# Patient Record
Sex: Female | Born: 1935 | Race: White | Hispanic: No | Marital: Married | Smoking: Never smoker
Health system: Southern US, Community
[De-identification: ages and names within clinical notes are randomized; demographics above are authoritative.]

## PROBLEM LIST (undated history)

## (undated) DIAGNOSIS — F419 Anxiety disorder, unspecified: Secondary | ICD-10-CM

## (undated) DIAGNOSIS — E039 Hypothyroidism, unspecified: Secondary | ICD-10-CM

## (undated) DIAGNOSIS — F32A Depression, unspecified: Secondary | ICD-10-CM

## (undated) DIAGNOSIS — F0390 Unspecified dementia without behavioral disturbance: Secondary | ICD-10-CM

## (undated) DIAGNOSIS — G4700 Insomnia, unspecified: Secondary | ICD-10-CM

## (undated) DIAGNOSIS — I10 Essential (primary) hypertension: Secondary | ICD-10-CM

## (undated) DIAGNOSIS — E079 Disorder of thyroid, unspecified: Secondary | ICD-10-CM

## (undated) DIAGNOSIS — I4891 Unspecified atrial fibrillation: Secondary | ICD-10-CM

## (undated) DIAGNOSIS — I639 Cerebral infarction, unspecified: Secondary | ICD-10-CM

## (undated) DIAGNOSIS — F329 Major depressive disorder, single episode, unspecified: Secondary | ICD-10-CM

## (undated) DIAGNOSIS — H547 Unspecified visual loss: Secondary | ICD-10-CM

## (undated) HISTORY — PX: APPENDECTOMY: SHX54

## (undated) HISTORY — PX: ABDOMINAL HYSTERECTOMY: SHX81

## (undated) HISTORY — PX: EYE SURGERY: SHX253

---

## 1997-03-23 ENCOUNTER — Ambulatory Visit (HOSPITAL_COMMUNITY): Admission: RE | Admit: 1997-03-23 | Discharge: 1997-03-23 | Payer: Self-pay | Admitting: General Surgery

## 1997-08-15 ENCOUNTER — Ambulatory Visit: Admission: RE | Admit: 1997-08-15 | Discharge: 1997-08-15 | Payer: Self-pay | Admitting: Urology

## 1997-08-24 ENCOUNTER — Other Ambulatory Visit: Admission: RE | Admit: 1997-08-24 | Discharge: 1997-08-24 | Payer: Self-pay | Admitting: General Surgery

## 1997-10-15 ENCOUNTER — Observation Stay (HOSPITAL_COMMUNITY): Admission: RE | Admit: 1997-10-15 | Discharge: 1997-10-16 | Payer: Self-pay | Admitting: Urology

## 1998-08-05 ENCOUNTER — Ambulatory Visit (HOSPITAL_COMMUNITY): Admission: RE | Admit: 1998-08-05 | Discharge: 1998-08-05 | Payer: Self-pay | Admitting: Urology

## 1999-03-20 ENCOUNTER — Encounter: Admission: RE | Admit: 1999-03-20 | Discharge: 1999-03-20 | Payer: Self-pay | Admitting: General Surgery

## 1999-03-20 ENCOUNTER — Encounter: Payer: Self-pay | Admitting: General Surgery

## 1999-07-22 ENCOUNTER — Inpatient Hospital Stay (HOSPITAL_COMMUNITY): Admission: EM | Admit: 1999-07-22 | Discharge: 1999-07-30 | Payer: Self-pay | Admitting: Cardiology

## 1999-07-23 ENCOUNTER — Encounter: Payer: Self-pay | Admitting: Cardiology

## 1999-07-25 ENCOUNTER — Encounter: Payer: Self-pay | Admitting: Cardiology

## 1999-07-25 ENCOUNTER — Encounter: Payer: Self-pay | Admitting: *Deleted

## 1999-07-30 ENCOUNTER — Inpatient Hospital Stay (HOSPITAL_COMMUNITY)
Admission: RE | Admit: 1999-07-30 | Discharge: 1999-08-14 | Payer: Self-pay | Admitting: Physical Medicine and Rehabilitation

## 1999-08-18 ENCOUNTER — Encounter
Admission: RE | Admit: 1999-08-18 | Discharge: 1999-10-15 | Payer: Self-pay | Admitting: Physical Medicine and Rehabilitation

## 1999-10-25 ENCOUNTER — Other Ambulatory Visit: Admission: RE | Admit: 1999-10-25 | Discharge: 1999-10-25 | Payer: Self-pay | Admitting: General Surgery

## 1999-10-30 ENCOUNTER — Encounter: Payer: Self-pay | Admitting: General Surgery

## 1999-10-30 ENCOUNTER — Encounter: Admission: RE | Admit: 1999-10-30 | Discharge: 1999-10-30 | Payer: Self-pay | Admitting: General Surgery

## 1999-11-03 ENCOUNTER — Ambulatory Visit (HOSPITAL_BASED_OUTPATIENT_CLINIC_OR_DEPARTMENT_OTHER): Admission: RE | Admit: 1999-11-03 | Discharge: 1999-11-03 | Payer: Self-pay | Admitting: General Surgery

## 2000-06-16 ENCOUNTER — Encounter: Payer: Self-pay | Admitting: General Surgery

## 2000-06-16 ENCOUNTER — Encounter: Admission: RE | Admit: 2000-06-16 | Discharge: 2000-06-16 | Payer: Self-pay | Admitting: General Surgery

## 2000-09-16 ENCOUNTER — Inpatient Hospital Stay (HOSPITAL_COMMUNITY): Admission: RE | Admit: 2000-09-16 | Discharge: 2000-09-20 | Payer: Self-pay | Admitting: Orthopedic Surgery

## 2000-09-16 ENCOUNTER — Encounter: Payer: Self-pay | Admitting: Orthopedic Surgery

## 2000-09-20 ENCOUNTER — Inpatient Hospital Stay (HOSPITAL_COMMUNITY)
Admission: RE | Admit: 2000-09-20 | Discharge: 2000-09-30 | Payer: Self-pay | Admitting: Physical Medicine & Rehabilitation

## 2001-03-09 ENCOUNTER — Encounter: Admission: RE | Admit: 2001-03-09 | Discharge: 2001-05-05 | Payer: Self-pay | Admitting: Orthopedic Surgery

## 2001-06-02 ENCOUNTER — Encounter: Admission: RE | Admit: 2001-06-02 | Discharge: 2001-06-02 | Payer: Self-pay | Admitting: General Surgery

## 2001-06-02 ENCOUNTER — Encounter: Payer: Self-pay | Admitting: General Surgery

## 2002-04-19 ENCOUNTER — Encounter: Admission: RE | Admit: 2002-04-19 | Discharge: 2002-04-19 | Payer: Self-pay | Admitting: Unknown Physician Specialty

## 2002-04-19 ENCOUNTER — Ambulatory Visit (HOSPITAL_BASED_OUTPATIENT_CLINIC_OR_DEPARTMENT_OTHER): Admission: RE | Admit: 2002-04-19 | Discharge: 2002-04-19 | Payer: Self-pay | Admitting: Oral Surgery

## 2002-04-24 ENCOUNTER — Ambulatory Visit (HOSPITAL_BASED_OUTPATIENT_CLINIC_OR_DEPARTMENT_OTHER): Admission: RE | Admit: 2002-04-24 | Discharge: 2002-04-24 | Payer: Self-pay | Admitting: Oral Surgery

## 2002-06-30 ENCOUNTER — Ambulatory Visit: Admission: RE | Admit: 2002-06-30 | Discharge: 2002-09-07 | Payer: Self-pay | Admitting: Radiation Oncology

## 2002-07-05 ENCOUNTER — Encounter: Admission: RE | Admit: 2002-07-05 | Discharge: 2002-07-05 | Payer: Self-pay | Admitting: Dentistry

## 2002-09-08 ENCOUNTER — Ambulatory Visit (HOSPITAL_COMMUNITY): Admission: RE | Admit: 2002-09-08 | Discharge: 2002-09-08 | Payer: Self-pay | Admitting: Radiation Oncology

## 2002-09-10 ENCOUNTER — Encounter: Payer: Self-pay | Admitting: Internal Medicine

## 2002-09-10 ENCOUNTER — Inpatient Hospital Stay (HOSPITAL_COMMUNITY): Admission: EM | Admit: 2002-09-10 | Discharge: 2002-09-18 | Payer: Self-pay | Admitting: Emergency Medicine

## 2002-09-18 ENCOUNTER — Encounter: Payer: Self-pay | Admitting: Orthopedic Surgery

## 2002-10-05 ENCOUNTER — Ambulatory Visit (HOSPITAL_COMMUNITY): Admission: RE | Admit: 2002-10-05 | Discharge: 2002-10-05 | Payer: Self-pay | Admitting: Radiation Oncology

## 2002-12-18 ENCOUNTER — Ambulatory Visit: Admission: RE | Admit: 2002-12-18 | Discharge: 2002-12-18 | Payer: Self-pay | Admitting: Radiation Oncology

## 2002-12-20 ENCOUNTER — Ambulatory Visit: Admission: RE | Admit: 2002-12-20 | Discharge: 2002-12-20 | Payer: Self-pay | Admitting: Radiation Oncology

## 2002-12-23 ENCOUNTER — Emergency Department (HOSPITAL_COMMUNITY): Admission: EM | Admit: 2002-12-23 | Discharge: 2002-12-23 | Payer: Self-pay | Admitting: Emergency Medicine

## 2003-02-15 ENCOUNTER — Ambulatory Visit: Admission: RE | Admit: 2003-02-15 | Discharge: 2003-02-15 | Payer: Self-pay | Admitting: Radiation Oncology

## 2003-03-08 ENCOUNTER — Ambulatory Visit: Admission: RE | Admit: 2003-03-08 | Discharge: 2003-03-08 | Payer: Self-pay | Admitting: Radiation Oncology

## 2003-04-12 ENCOUNTER — Ambulatory Visit: Admission: RE | Admit: 2003-04-12 | Discharge: 2003-04-12 | Payer: Self-pay | Admitting: Radiation Oncology

## 2003-04-20 ENCOUNTER — Ambulatory Visit: Admission: RE | Admit: 2003-04-20 | Discharge: 2003-04-20 | Payer: Self-pay | Admitting: Radiation Oncology

## 2003-06-14 ENCOUNTER — Encounter: Admission: RE | Admit: 2003-06-14 | Discharge: 2003-06-28 | Payer: Self-pay | Admitting: Internal Medicine

## 2003-08-23 ENCOUNTER — Ambulatory Visit: Admission: RE | Admit: 2003-08-23 | Discharge: 2003-08-23 | Payer: Self-pay | Admitting: Radiation Oncology

## 2003-08-30 ENCOUNTER — Ambulatory Visit: Admission: RE | Admit: 2003-08-30 | Discharge: 2003-08-30 | Payer: Self-pay | Admitting: Radiation Oncology

## 2003-08-30 ENCOUNTER — Ambulatory Visit (HOSPITAL_COMMUNITY): Admission: RE | Admit: 2003-08-30 | Discharge: 2003-08-30 | Payer: Self-pay | Admitting: Radiation Oncology

## 2004-01-03 ENCOUNTER — Ambulatory Visit: Admission: RE | Admit: 2004-01-03 | Discharge: 2004-01-03 | Payer: Self-pay | Admitting: Radiation Oncology

## 2004-05-07 ENCOUNTER — Ambulatory Visit: Admission: RE | Admit: 2004-05-07 | Discharge: 2004-05-07 | Payer: Self-pay | Admitting: Radiation Oncology

## 2004-09-03 ENCOUNTER — Ambulatory Visit: Payer: Self-pay | Admitting: Dentistry

## 2004-12-15 ENCOUNTER — Encounter: Admission: RE | Admit: 2004-12-15 | Discharge: 2004-12-15 | Payer: Self-pay | Admitting: Internal Medicine

## 2005-04-18 ENCOUNTER — Inpatient Hospital Stay (HOSPITAL_COMMUNITY): Admission: EM | Admit: 2005-04-18 | Discharge: 2005-04-21 | Payer: Self-pay | Admitting: Emergency Medicine

## 2005-05-13 ENCOUNTER — Ambulatory Visit: Payer: Self-pay | Admitting: Dentistry

## 2005-09-16 ENCOUNTER — Ambulatory Visit: Payer: Self-pay | Admitting: Dentistry

## 2005-10-28 ENCOUNTER — Encounter: Admission: RE | Admit: 2005-10-28 | Discharge: 2005-10-28 | Payer: Self-pay | Admitting: Orthopedic Surgery

## 2006-02-09 ENCOUNTER — Ambulatory Visit: Payer: Self-pay | Admitting: Dentistry

## 2006-04-21 ENCOUNTER — Inpatient Hospital Stay (HOSPITAL_COMMUNITY): Admission: EM | Admit: 2006-04-21 | Discharge: 2006-04-26 | Payer: Self-pay | Admitting: Emergency Medicine

## 2006-04-22 ENCOUNTER — Ambulatory Visit: Payer: Self-pay | Admitting: Physical Medicine & Rehabilitation

## 2006-06-29 ENCOUNTER — Ambulatory Visit: Payer: Self-pay | Admitting: Dentistry

## 2006-11-10 ENCOUNTER — Ambulatory Visit: Payer: Self-pay | Admitting: Dentistry

## 2007-04-20 ENCOUNTER — Ambulatory Visit: Payer: Self-pay | Admitting: Dentistry

## 2007-08-23 ENCOUNTER — Ambulatory Visit: Payer: Self-pay | Admitting: Dentistry

## 2008-04-25 ENCOUNTER — Ambulatory Visit: Payer: Self-pay | Admitting: Dentistry

## 2008-10-04 ENCOUNTER — Ambulatory Visit: Payer: Self-pay | Admitting: Dentistry

## 2008-10-17 ENCOUNTER — Encounter (INDEPENDENT_AMBULATORY_CARE_PROVIDER_SITE_OTHER): Payer: Self-pay | Admitting: *Deleted

## 2009-02-20 ENCOUNTER — Ambulatory Visit: Payer: Self-pay | Admitting: Dentistry

## 2009-02-22 ENCOUNTER — Encounter: Admission: AD | Admit: 2009-02-22 | Discharge: 2009-02-22 | Payer: Self-pay | Admitting: Dentistry

## 2009-08-27 ENCOUNTER — Encounter: Admission: RE | Admit: 2009-08-27 | Discharge: 2009-08-27 | Payer: Self-pay | Admitting: Internal Medicine

## 2010-06-20 NOTE — H&P (Signed)
[name], Diana [name]             ACCOUNT NO.:  [id number]   MEDICAL RECORD NO.:  [id number]          PATIENT TYPE:  EMS   LOCATION:  ED                           FACILITY:  [facility]   PHYSICIAN:  Gwen Pounds, MD       DATE OF BIRTH:  [birth year]   DATE OF ADMISSION:  04/18/2005  DATE OF DISCHARGE:                                HISTORY & PHYSICAL   PRIMARY CARE Kazuo Durnil:  Geoffry Paradise, M.D.   CHIEF COMPLAINT:  More confused than usual.   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  She was seen April 10, 2005 by Wynne Dust, our pharmacist  in the office, and had an INR of 1.5 after a recent nerve block by Dr.  Ethelene Hal, and was on Lovenox at that time as a bridge with her Coumadin.  Yesterday, the patient was not herself and a little more confused, and the  family wondered if she had just taken a little bit of extra medication.  At  about 3 a.m., the patient had a light on in her room, and the patient came  and checked on her.  She was incoherent.  He stayed with her overnight, and  on waking her up this morning, she was still very confused and disoriented.  She also had some slight agitation, and the patient thought that her own  daughter was the patient's mother.  She is currently calm, being admitted  for further evaluation and treatment for this confusion.  Work-up in the  emergency room was otherwise unremarkable.  Her family denies that any other  issues have taken place over the last day or two.   PAST MEDICAL HISTORY:  1.  Anxiety.  2.  Insomnia.  3.  Hyperlipidemia.  4.  Hypothyroidism.  5.  Depression.  6.  Dementia.  7.  Glaucoma with near blindness.  8.  History of CVA.  9.  Atrial fibrillation, on  Coumadin.  10. Squamous cell carcinoma of the mandible required XRT.  11. 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  12. Hypertension.  13. Osteoarthritis.  14. Status post left total knee replacement.  15. Urinary tract infections.  16. Tonsil and adenoidectomy.  17. Bilateral cataract surgery.   ALLERGIES:  1.  SULFA.  2.  CODEINE.  3.  MACRODANTIN.  4.  ADHESIVE TAPE.   MEDICATIONS:  1.  Clorazepate 7.5 daily.  2.  Ambien 5 q.h.s.  3.  Zocor 20 daily.  4.  Levoxyl 125 daily.  5.  Coumadin as directed.  6.  MS Contin 30 b.i.d.  7.  Lexapro 10 daily.  8.  Aricept.  9.  Timolol.  10. Pilocarpine.  11. Lumigan.   SOCIAL HISTORY:  She is a smoker.  She has 1 daughter.  She is married.  She  lives at home with her husband.  FAMILY HISTORY:  Mother died in her 32s of kidney issues.  Father died at  the age of 31 of pneumonia.   REVIEW OF SYSTEMS:  The patient with dry mouth, not eating or drinking very  well.  Please see history of present illness for further details, but she  denies any current chest pain, but did have shortness of breath on arrival.   PHYSICAL EXAMINATION:  VITAL SIGNS:  Temperature 96.3, heart rate 76,  respiratory rate 18, blood pressure 192/98, down to 151/74.  GENERAL:  She is alert and oriented to person, [facility], a day.  is not oriented to year, month, and does not have any insight as to why she  is here.  HEENT:  Status post jaw surgery and changes compatible.  Oropharynx is dry.  PULMONARY:  Clear to auscultation bilaterally.  CARDIAC:  Regular at this time.  ABDOMEN:  Soft.  EXTREMITIES:  No clubbing, no cyanosis, no edema.   ANCILLARY DATA:  Urinalysis shows nitrites, many bacteria, but 0-3 white  cells per high power field.  Urine drug screen was positive for  benzodiazepines and opiates.  PTT was 46, PT INR of 2.4.  Ethyl alcohol less  than 5.  Sodium 136, potassium 4.2, chloride 98, bicarbonate 30, BUN 16,  creatinine  0.7, glucose 124.  LFT's are within normal limits.  White count  is 5.4.  Hemoglobin 13.2, platelet count 272, 81% segs.  EKG revealed sinus  bradycardia, left axis deviation.  Nothing acute.  Cranial CT revealed no  intracranial abnormalities.  Chest x-ray is hyperinflated.  No acute  cardiopulmonary disease.   ASSESSMENT:  This is an elderly female with mild dementia, being admitted  with increasing confusion and a questionable urinary tract infection.  Nothing else obvious going on.  I suppose she could have had a stroke or  that she could be overly medicated at this current time.   PLAN:  1.  Admit.  2.  IV fluids to be started.  3.  Treat for potential urinary tract infection with Cipro.  Will check      blood and urine cultures.  4.  Will try to keep INR between 2 and 3.0.  5.  Home medications.  6.  Eye drops per home.  7.  If IV fluids and antibiotics are not helpful, consider MRI in the      morning.  8.  No meningeal signs to suggest that she needs an LP.  Will follow her      blood pressure; it is a little bit elevated at this time, and will      regulate her medications carefully.      Gwen Pounds, MD  Electronically Signed     JMR/MEDQ  D:  04/18/2005  T:  04/20/2005  Job:  [id number]

## 2010-06-20 NOTE — Discharge Summary (Signed)
[facility]  Patient:    Diana [name], Diana [name] Visit Number: [id number] MRN: [id number]          Service Type: [facility] Location: [id number] Attending Physician:  Faith Rogue T Dictated by:   Mcarthur Rossetti. Angiulli, P.A. Adm. Date:  09/20/2000 Disc. Date: 09/30/00   CC:         Nadara Mustard, M.D.  Richard A. Jacky Kindle, M.D.   Discharge Summary  DISCHARGE DIAGNOSES: 1. Left total knee arthroplasty September 16, 2000. 2. Postoperative anemia. 3. History of cerebrovascular accident June 2001, with chronic Coumadin. 4. Hypothyroidism. 5. Hypertension. 6. E coli urinary tract infection.  HISTORY OF PRESENT ILLNESS:  A 77 year old white female with history of cerebrovascular accident in June of 2001, of which she did receive inpatient rehab services for, maintained on chronic Coumadin, was admitted September 16, 2000, with end-stage degenerative joint disease of the left knee and no relief with conservative care.  She underwent a left total knee arthroplasty September 16, 2000, per Nadara Mustard, M.D., weightbearing as tolerated.  Her Coumadin was resumed.  Her hospital course was uneventful. There was no chest pain or shortness of breath. She was minimal assistance for ambulation.  Active range of motion to the left knee was at 85 degrees. Latest INR was 1.3, hemoglobin 9.4.  Chemistries unremarkable.  She was admitted for a comprehensive rehab program.  PAST MEDICAL HISTORY:  Hypothyroidism and hypertension.  PAST SURGICAL HISTORY:  Appendectomy, thyroidectomy, foot surgery and cataract surgery.  ALLERGIES:  CODEINE, ASPIRIN, MORPHINE, SULFA AND MACRO.  PRIMARY M.D.:  Richard A. Jacky Kindle, M.D.  MEDICATIONS PRIOR TO ADMISSION: 1. Levoxyl 125 mcg daily. 2. Coumadin 4 mg daily. 3. Zestril 10 mg daily. 4. Tranxene 7.5 mg as needed 5. Eyedrops.  SOCIAL HISTORY:  Occasional alcohol, no tobacco.  She lives with her husband in [place], [place].  She was independent prior to admission.  She did not use any assistive device.  She lives in a one level home with no steps to entry.  Husband to assist on discharge.  Daughter in the area also.  HOSPITAL COURSE:  The patient did well while on rehabilitation services with therapies initiated on a b.i.d. basis.  The following issues were followed during the patients rehab course.  Pertaining to Ms. Harrisons left total knee arthroplasty, remained stable. Surgical site healing nicely.  No signs of infection.  She was weightbearing as tolerated with a walker.  Her range of motion continued to improve and she would follow up with Dr. Lajoyce Corners.  Pain medications were adjusted due to some mild mental status changes.  Her Tranxene, which had been as needed prior to hospital admission had been scheduled twice daily during hospital course.  Once these necessary adjustments were made, the patient returned to her baseline. All issues had also been discussed with family.  She continued on chronic Coumadin for history of cerebrovascular accident.  Latest INR was 3.1.  She would resume follow-up with her primary M.D., Dr. Jacky Kindle.  Her blood pressures remained controlled.  She was on her hormone supplement for hypothyroidism.  During her hospital course she was treated for an E coli urinary tract infection with Levaquin.  She had no dysuria or hematuria.  Due to a history of cerebrovascular accident and latest knee surgery, patients mood and spirits were somewhat depleted.  This was discussed as per family. She was placed on trial doses of Paxil on September 25, 2000, and monitored.  Family would provide necessary emotional support on discharge.  Overall for her functional mobility, she continued to progress. She was ambulating with a walker household distances, needing some assistance for lower body dressing.  Plan was for home health physical and occupational therapy as well as a nurse  with anticipated discharge for September 30, 2000, to home with family.  LATEST LABS:  INR of 3.1.  Hemoglobin on September 29, 2000, or 9.3; this remained stable, varying from 8.9 to 9.3.  Chemistries with sodium 133, potassium 3.9, BUN 15, creatinine 0.9.  DISCHARGE MEDICATIONS:  1. Coumadin daily.  The patient had been receiving 5 mg during her latest     adjustments per pharmacy and this will continue to be followed per Dr.     Jacky Kindle.  2. Zestril 10 mg daily.  3. Timoptic 0.5% one drop both eyes twice daily.  4. Synthroid 125 mcg daily.  5. Trinsicon twice daily.  6. Xanax 0.25 mg as needed.  7. Flomax 0.4 mg at bedtime.  8. Paxil 20 mg daily.  9. Tranxene continue as directed as prior to hospital admission. 10. Ultram 50 mg every six hours as needed.  ACTIVITY:  Weightbearing as tolerated with walker.  DIET:  Regular.  WOUND CARE:  Cleanse incision daily with warm soap and water.  SPECIAL INSTRUCTIONS:  Home health nurse for Coumadin therapy.  [facility] to provide nurse to be followed prothrombin times called to Dr. Jacky Kindle. Home health physical and occupational therapy also had been arranged.  FOLLOW-UP:  She should follow up with Dr. Lajoyce Corners in two weeks. Dictated by:   Mcarthur Rossetti. Angiulli, P.A. Attending Physician:  Faith Rogue T DD:  09/29/00 TD:  09/29/00 Job: [id number]

## 2010-06-20 NOTE — H&P (Signed)
[facility]  Patient:    Diana [name], Diana [name]                    MRN: [id number] Adm. Date:  [id number] Attending:  Nadara Mustard                         History and Physical  HISTORY OF PRESENT ILLNESS:  The patient is a 77 year old woman who has chronic osteoarthritis of her left knee. The patient has failed conservative care and has been cleared by Richard A. Jacky Kindle, M.D. and presents at this time for a left total knee arthroplasty.  ALLERGIES:  CODEINE causes nausea and vomiting. ASPIRIN, MORPHINE, SULFA, ADHESIVE TAPE, and MACRODANTIN.  CURRENT MEDICATIONS: 1. Levoxyl 125 mcg q.d. 2. Coumadin 4 mg per day. 3. ______  25 mg p.r.n. 4. Zestril 10 mg q.d. 5. Clorazepate 7.5 mg p.r.n. 6. Ambien 5 mg q.p.m. 7. Timolol eyedrops 0.5% b.i.d. for glaucoma.  PAST MEDICAL HISTORY:  Significant for thyroidectomy, appendectomy, foot surgery x 2, benign breast lump excision, bladder surgery x 2, arthritis.  REVIEW OF SYSTEMS:  Positive for hypertension, stroke, glaucoma, and hypothyroidism.  FAMILY HISTORY:  Positive for arthritis.  PHYSICAL EXAMINATION:  VITAL SIGNS:  Temperature 95.7, pulse 72, respiratory rate 12, blood pressure 118/74.  GENERAL:  She is in no acute distress.  NECK:  Supple. No bruits.  LUNGS:  Clear to auscultation.  CARDIOVASCULAR:  Regular rate and rhythm.  EXTREMITIES:  On examination of the left lower extremity, she has 10 degree flexion contracture, flexion to 90 degrees. No significant varus valgus ______ ambulation. Does have good palpable pulses.  LABORATORY DATA:  Radiographs show a tricompartmental osteoarthritis of the left knee.  ASSESSMENT:  Osteoarthritis of the left knee.  PLAN:  She is scheduled for a left total knee arthroplasty at this time. The risks and benefits were discussed including infection, neurovascular injury, persistent pain, failure of implant, need for additional surgery. The  patient states she understands and wishes to proceed at this time.DD:  09/16/00 TD:  09/16/00 Job: [id number]

## 2010-06-20 NOTE — H&P (Signed)
[facility]  Patient:    Diana [name], Diana [name]               MRN: [id number] Adm. Date:  [id number] Attending:  De Nurse                         History and Physical  CHIEF COMPLAINT:  Dizzy, vomiting.  PRESENT ILLNESS:  This 77 year old white female, healthy, working daily, had seemed to be in her usual state of health.  She was preparing to go to work when she rather abruptly developed some dizziness, sense of imbalance, which quickly progressed over 30 to 60 minutes to major imbalance, nausea, then vomiting on several occasions.  She was brought to the office at around noon with this symptomatology, obvious gross balance disturbance.  Vital signs were normal.  Electrocardiogram showed occasional VPC but otherwise normal.  She also complained of some numbness in the right fingers.  There were no lateralizing neurologic findings.  With the sense that this was probably an acute benign vertigo, she was kept in the office, given Phenergan IM.  Over several hours, she continued with her symptoms to include several rounds of vomiting.  It was clear that she could not be cared for at home, and so she was brought to the hospital for observation admission, and was transport-required.  There was scant improvement overnight in the hospital to include total inability to either sit or stand without major assistance, hence, observation status is changed to full admission status.  PAST MEDICAL HISTORY:  There is no semblance of any previous episode to the present illness.  PAST SURGICAL HISTORY:  Prior operations include tonsillectomy, appendectomy, benign left breast cyst aspiration, thyroidectomy for goiter, several different foot surgeries, urethral sling surgery in September of 1999.  OBSTETRICAL HISTORY:  One pregnancy and delivery.  OTHER HOSPITALIZATIONS:  In years prior to my first contact in 1996, she had hospitalization for anxiety along  with tests for hypoglycemia.  ALLERGIES:  Allergies are reported to SMOKE, SULFA, CODEINE, MACRODANTIN, ADHESIVE, ANTIDEPRESSANT MEDICATIONS.  MEDICATIONS:  Prempro 2.5 mg, Synthroid 0.125 mg, Prinivil 20 mg, all daily; also, clorazepate 7.5 mg most evenings as relaxant and sleep aid.  FAMILY HISTORY:  Father died at age 4 of pneumonia.  Mother died at age 56 of "renal poisoning."  There are three living siblings -- several with some chronic illnesses and one deceased sibling, dying in infancy.  No family history of malignancy, diabetes, major vascular disease.  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  She does not smoke.  She generally is high-strung, has ongoing strains in her marriage of 43 years.  Her single daughter has been an ongoing source of concern to include need for some financial support, although situation has been ameliorated with the recent successful marriage of that daughter.  REVIEW OF SYSTEMS:  Prior to present illness, any symptoms related by patient to stress.  She has not had chronic headache, seizure or syncope.  There has not been chest pain, breathing disturbance, sense of palpitation.  Bowel function satisfactory.  There has been a major ongoing problem with urinary tract to include incontinence.  It should have been mentioned in past history above that she had urethral sling surgery in September of 1999 -- unsuccessful, with ongoing urinary incontinence.  No significant arthritis.  PHYSICAL EXAMINATION:  GENERAL:  Patient is well-nourished, well-developed, actually appears younger than her  stated age.  She seems acutely ill and uncomfortable.  She has been observed to have retching and vomiting.  Any attempts to sit up or move head aggravate major dizziness, vertigo.  VITAL SIGNS:  Blood pressure 148/80, temperature 97, pulse 80, respirations 14.  HEENT:  Ears normal.  Pupils, conjunctivae  and sclerae are normal.  The patient seems to be mildly proptotic.  Extraocular movements are full, possibly some terminal nystagmus to left.  Mucous membranes remain moist with pharynx negative.  NECK:  Supple.  Thyroid not enlarged -- thyroidectomy scar, with no palpable thyroid tissue.  No carotid bruits.  CHEST:  Clear.  BREASTS:  No masses.  HEART:  Sinus rhythm.  Occasional PVC noted on office EKG.  ABDOMEN:  Soft, nontender.  Liver, spleen or abdominal masses not palpable.  PELVIC:  Not pertinent to present illness and not performed.  RECTAL:  Not pertinent to present illness and not performed.  EXTREMITIES:  Pedal pulse is present.  No edema.  NEUROLOGIC:  No lateralizing motor, sensory or reflex changes.  IMPRESSION: 1. Acute labyrinthitis -- vertigo with vomiting -- possibly on a circulatory    basis. 2. History of hypertension, hormone replacement, thyroidectomy, on    replacement.  PLAN:  The patient will be continued intravenous fluids, adding a diet as necessary.  CT scan will be done today to rule out any intracranial process. She is on meclizine. DD:  07/24/99 TD:  07/24/99 Job: [id number]

## 2010-06-20 NOTE — Consult Note (Signed)
Diana [name], Diana [name]             ACCOUNT NO.:  [id number]   MEDICAL RECORD NO.:  [id number]          PATIENT TYPE:  INP   LOCATION:  6709                         FACILITY:  [facility]   PHYSICIAN:  Erasmo Leventhal, M.D.DATE OF BIRTH:  04/11/1925   DATE OF CONSULTATION:  04/21/2006  DATE OF DISCHARGE:                                 CONSULTATION   ORTHOPEDIC CONSULTATION   HISTORY OF PRESENT ILLNESS:  Diana [name] is a 77 year old Caucasian  female currently admitted by Dr. Buren Kos for inability to ambulate  and left groin hip and buttock pain.  The patient and her daughter  report that she has fallen a couple of times this week.  She fell last  night without syncope and landed on her left side.  She had difficulty  ambulating and presented to [facility].  There she  was seen by Dr. Clelia Croft where she was unable to ambulate and take care of  herself.  Plain x-rays initially were unremarkable.  However, CT scan  has revealed left hemipelvis fractures, and an orthopedic consultation  was obtained.   The patient denies any syncopal episodes, or chest pain, or shortness of  breath with her falls.  She only complains of left lateral thigh buttock  pain and left groin pain.   ALLERGIES:  1. CODEINE.  2. PERCOCET.  3. SULFA.   CURRENT MEDICATIONS:  Levoxyl, Coumadin, Lexapro, Zocor, Ambien,  morphine, clorazepate, pilocarpine, and Lumigan.   PAST MEDICAL HISTORY:  1. Status post history of CVA.  2. Atrial fibrillation on anticoagulation Coumadin.  3. Hypertension.  4. Mild dementia.  5. Right mandible squamous cell carcinoma status post resection,      radiation.  6. Increased lipid.  7. Hypothyroid.  8. Anxiety and depression.  9. Glaucoma.  10.Osteoarthritis.  11.Recurrent UTIs.  12.Status post left total knee replacement documented in the past with      continued chronic pain.  She has had several evaluations without      cause for such.  Dr.  __________ evaluated for a painful left total      knee last fall, but workup was negative.   SOCIAL HISTORY:  Is married.  Lives with her husband, who is 21 years of  age.  No tobacco or alcohol.   FAMILY HISTORY:  Noncontributory.   PHYSICAL EXAMINATION:  VITAL SIGNS:  Blood pressure is 147/79, pulse 70  and irregular, temperature 97.9, room-air oxygenation 99%.  GENERAL:  Awake, alert, oriented to person, place, time, and  circumstances.  Accompanied by her daughter.  Thin, cachectic elderly  appearing female.  BILATERAL EXTREMITIES:  Upper extremities are unremarkable except for a  small abrasion left forearm.  Right lower extremity unremarkable.  Left  lower extremity, leg lengths are equal.  There is a knee brace on the  left side which she wears on a chronic basis.  Tibia, knee, fibula  unremarkable.  General internal and external rotation of the hip is  nonpainful.  She dose have a tender lateral hip and buttock region.  She  cannot actively flex her left hip or lift  the left leg on her own.  Distal neurovascular examination is grossly intact.   Plain x-rays and CT scan were reviewed, and these revealed the following  findings:  She has a left nondisplaced sacral fracture, a left  nondisplaced inferior pubic ramus fracture, a small nondisplaced left  anterior acetabular fracture.   IMPRESSION:  Left hemipelvis fractures as described above, relatively  stable.   RECOMMENDATIONS:  At this point and time, they just need analgesics,  muscle relaxants of choice, PT/OT consultation.  I would recommend 50%  partial weightbearing for comfort for the first four weeks and then  weightbearing as tolerated with new x-rays at that time.  The fracture  should heal in approximately eight weeks, and at this point and time she  is anticoagulated on Coumadin for DVT prophylaxis.  She should be  mobilized as soon as possible.  I would recommend calcium and vitamin D  500 mg 1 three times a  day after meals.  Would add Robaxin for muscle  spasm, and she already has morphine ordered for pain.   Analgesics of choice per Dr. Clelia Croft, Dr. Jacky Kindle.  All questions  encouraged and answered by the patient and her daughter in detail.  I  also called and discussed this with Dr. Clelia Croft.           ______________________________  Erasmo Leventhal, M.D.     RAC/MEDQ  D:  04/21/2006  T:  04/22/2006  Job:  [id number]   cc:   Kari Baars, M.D.  Geoffry Paradise, M.D.

## 2010-06-20 NOTE — Discharge Summary (Signed)
[place]  Patient:    Diana [name], Diana [name] Visit Number: [id number] MRN: [id number]          Service Type: [facility] Location: [id number] Attending Physician:  Faith Rogue T Dictated by:   Nadara Mustard, M.D. Admit Date:  09/20/2000 Discharge Date: 09/30/2000                             Discharge Summary  DIAGNOSIS:  Osteoarthritis, left knee.  PROCEDURE:  Left total knee arthroplasty.  DISPOSITION:  Discharged to rehab in stable condition.  Followup in the office in two weeks.  HISTORY OF PRESENT ILLNESS:  The patient is a 77 year old woman with chronic osteoarthritis of the left knee.  She has failed conservative care, was cleared medically, and presents at this time for total knee replacement.  HOSPITAL COURSE:  Brief operative note of September 16, 2000, diagnosis of osteoarthritis of left knee.  Procedure was left total knee arthroplasty with [employer] components, #7 patella, femur, and tibial component, 10 mm poly tray, posterior stabilized.  Surgeon was Dr. Lajoyce Corners.  Anesthesia was general endotracheal.  Estimated blood loss was minimal.  Antibiotics 1 g of Kefzol.  Tourniquet time 84 minutes at 300 mmHg.  Disposition to PACU in stable condition.  The patients postoperative course was essentially unremarkable.  She was treated with Coumadin for DVT prophylaxis and Kefzol for infection prophylaxis.  She progressed well with therapy and was transferred to rehab in stable condition on September 20, 2000.  Followup in the office two weeks after discharge. Dictated by:   Nadara Mustard, M.D. Attending Physician:  Faith Rogue T DD:  10/13/00 TD:  10/13/00 Job: [id number]

## 2010-06-20 NOTE — H&P (Signed)
[name], [name]             ACCOUNT NO.:  [id number]   MEDICAL RECORD NO.:  [id number]          PATIENT TYPE:  EMS   LOCATION:  MAJO                         FACILITY:  [facility]   PHYSICIAN:  Kari Baars, M.D.  DATE OF BIRTH:  1926-01-03   DATE OF ADMISSION:  04/21/2006  DATE OF DISCHARGE:                              HISTORY & PHYSICAL   CHIEF COMPLAINT:  Left thigh pain following a fall.   HISTORY OF PRESENT ILLNESS:  Diana [name] is a 77 year old white female  patient of Dr. 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  She states that  she has had about a three-day history of generalize malaise with no  focal symptoms.  Last evening after getting out of bed and walking, she  fell and landed on her left side.  She states that she slipped after  opening the door.  She did not have any loss of consciousness and did  not hit her head.  She was helped back into bed with her husband, but  had extreme difficulty bearing weight.  This morning due to difficulty  getting out of bed, she called EMS because she was unable to walk.  She  was evaluated in the emergency department and had negative plain x-rays  of her pelvis, left femur, and hip.  Her family at this point does not  feel that she can return home because she is unable to bear weight.   REVIEW OF SYSTEMS:  All systems were reviewed with the patient and are  negative except as in the HPI with the following exceptions:  She has  had chronic left knee pain and has had multiple orthopedic evaluations  by several different orthopedists.  This is the knee that she had a knee  replacement.  She has been seeing a chiropractor recently with some  improvement with immobilizer therapy.  She takes chronic morphine for  this  pain.  She also reports insomnia and takes Ambien.  She took Ambien  and morphine before her fall last night.  She had also had recent  diarrhea.  Multiple recurrent urinary tract infections including recent  antibiotic therapy, resolved.  All other systems negative.   PAST MEDICAL HISTORY:  1. Atrial fibrillation on anticoagulation.  2. History of stroke.  3. Hypertension.  4. Mild dementia.  5. Right mandible squamous cell cancer status post resection with      tracheostomy now reversed and radiation therapy (T4N0).  6. Hyperlipidemia.  7. Hyperthyroidism.  8. Anxiety/depression.  9. Glaucoma.  10.Osteoarthritis.  11.Recurrent urinary tract infection.  12.Status post left total knee replacement.   CURRENT MEDICATIONS:  1. Levoxyl 125 mcg daily.  2. Coumadin 4 mg daily.  3. Lexapro 20 mg daily.  4. Zocor 20 mg daily.  5. Ambien 5 mg daily.  6. Morphine 30 mg b.i.d.  7. Clorazepate 7.5 mg daily.  8. Pilocarpine eye drops q.i.d. to the left  eye.  9. Lumigan eye drops bilaterally daily.   ALLERGIES:  CODEINE, PERCOCET AND SULFA.   SOCIAL HISTORY:  She is married and lives with her husband.  Smoking  history.   FAMILY HISTORY:  Mother died from kidney disease.  Father had pneumonia.   PHYSICAL EXAMINATION:  VITALS:  Temperature 97.9, blood pressure 147/79,  pulse 69, respirations 18, oxygen saturation 99% on room air.  GENERAL:  She is pleasant, alert and oriented in no acute distress.  HEENT:  Shows pupils equal, round and reactive to light.  Extraocular  movements intact.  Oropharynx is moist.  Chronic changes from left neck  surgery.  HEART:  Regular rate and rhythm without murmurs, rubs of gallops.  LUNGS:  Clear to auscultation bilaterally.  ABDOMEN:  Soft, nondistended, nontender with normoactive bowel sounds.  EXTREMITIES:  Are symmetric with no rotation.  She has minimal  tenderness over the left medial thigh.  Has on the left knee  immobilizer.  There is no  effusion.  No masses appreciated.   LABS:  CBC shows a white count of 7.2, hemoglobin 13.1, platelets 186.  B-Met significant for sodium 135, potassium 4.4, chloride 104, bicarb  29, BUN 16, creatinine 0.7, glucose 105.  INR is 2.0.   STUDIES:  Hip, pelvis, and left femur x-rays show no fracture.   ASSESSMENT/PLAN:  1. Left thigh pain following a fall - there was no evidence of      fracture on the x-rays.  I will obtain a CT of the left femur to      rule out occult fracture or a hematoma in the setting of her      anticoagulation therapy.  Will ask orthopedics to see the patient      pending these results.  Will continue her morphine as needed.      PT/OT evaluation.  2. Failure to thrive - her fall may have been precipitated by      polypharmacy and sedating medications.  Will evaluate for need for      acute rehab versus possibility for return home with physical      therapy evaluation.  Obtain urinalysis to rule out UTI.  Obtain CK      to rule out rhabdomyolysis, low likelihood.  3. Polypharmacy - defer medication changes to her primary care      physician.  May want to consider decreasing sedating medications.  4. Atrial fibrillation - continue Coumadin per pharmacy protocol.  She      is currently in sinus rhythm.  5. Disposition - anticipate discharge to home with home health      physical therapy or rehab placement depending on how she does with      her pain.      Kari Baars, M.D.  Electronically Signed     WS/MEDQ  D:  04/21/2006  T:  04/21/2006  Job:  [id number]   cc:   Geoffry Paradise, M.D.  Ronald A. Darrelyn Hillock, M.D.

## 2010-06-20 NOTE — Op Note (Signed)
NAME:  Diana [name], Diana [name]                       ACCOUNT NO.:  [id number]   MEDICAL RECORD NO.:  [id number]                   PATIENT TYPE:  AMB   LOCATION:  DSC                                  FACILITY:  [facility]   PHYSICIAN:  Hinton Dyer, D.D.S.            DATE OF BIRTH:  1925-06-07   DATE OF PROCEDURE:  04/24/2002  DATE OF DISCHARGE:                                 OPERATIVE REPORT   PREOPERATIVE DIAGNOSIS:  Osteomyelitis of the right mandible.   POSTOPERATIVE DIAGNOSIS:  Osteomyelitis of the right mandible.   PROCEDURES:  1. Surgical debridement of osteomyelitis.  2. Extraction of teeth #25, 26, and 27.   ANESTHESIA:  General.   SURGEON:  Hinton Dyer, D.D.S.   ESTIMATED BLOOD LOSS:  Approximately 100 mL.   CONDITION AFTER SURGERY:  Good.   DESCRIPTION OF PROCEDURE:  Following preoperative medication, the patient  was brought and placed in the supine position, in which she remained  throughout the whole procedure.  She was intubated by an oral endotracheal  tube and the tube was placed off to the left side.  The face was prepped and  draped in the usual fashion for an intraoral procedure.  The throat pack was  placed around the endotracheal tube using a moist open 4 x 4 gauze and 2%  Xylocaine with 1:100,000 epinephrine was infiltrated on the medial aspect of  the right mandible.  The buccal aspect of the mandible was also infiltrated.  A total of 3 mL was infiltrated.  A 15 blade was used to make an incision  over the crest of the ridge and around the defect of the granulomatous  tissue that was extended on the crest of the ridge, extending from tooth #28  to the tooth #30, 31 area.  This tissue was then excised using the 15 blade,  curetted off the bone, and submitted in its entirety to pathology.  Immediately the mandible underneath was exposed and the area was curetted,  removing any loose or abnormal-looking bone.  Once I was down to hard,  normal-looking  bone, the area was irrigated out.  Tooth #27 was elevated  with a periosteal elevator and removed from the socket with the same  periosteal elevator.  Tooth #26 was also grossly mobile and removed with the  same periosteal elevator, and tooth #25 was also very mobile and removed  from the socket.  All three sockets were curetted aggressively, removing  granulation tissue and abnormal bone around the apices of all three teeth.  There was no abnormality anterior to tooth #25, and so no further teeth were  removed.  The area was again aggressively curetted, removing any remnants of  soft tissue and loose bone until good, hard bone was encountered.  The whole  defect was then examined and then a round bur and copious irrigation were  used to do a peripheral ostectomy, smoothing off any sharp areas  and  removing a layer of bone around the whole defect.  Bleeding sites were noted  in the bone from all areas, and the area was copiously irrigated out.  There  was enough flap to close the defect primarily and after copious irrigation,  multiple 4-0 Vicryl sutures were used to close the defect.  The throat pack  was then removed.  She was extubated on the table and returned to the recovery room in good  condition.  She is on Augmentin and is being sent home with a prescription  for Vicodin x 20, one or two q.4h. p.r.n. pain.  She will be followed by me  closely in my private office.                                                Hinton Dyer, D.D.S.    Lynden Ang  D:  04/24/2002  T:  04/24/2002  Job:  [id number]

## 2010-06-20 NOTE — Discharge Summary (Signed)
NAME:  Diana [name], Diana [name]                       ACCOUNT NO.:  [id number]   MEDICAL RECORD NO.:  [id number]                   PATIENT TYPE:  INP   LOCATION:  0462                                 FACILITY:  [facility]   PHYSICIAN:  Geoffry Paradise, M.D.               DATE OF BIRTH:  [birth year]   DATE OF ADMISSION:  09/10/2002  DATE OF DISCHARGE:  09/18/2002                                 DISCHARGE SUMMARY   DIAGNOSES AT THE TIME OF DISCHARGE:  1. Hypotension secondary to volume depletion.  2. Radiation mucositis, esophagitis, and enteritis.  3. Squamous cell carcinoma of the right mandible, status post surgery and     radiation therapy.  4. Essential hypertension.  5. Primary hypothyroidism.  6. Remote cerebrovascular accident, on chronic Coumadin.  7. Chronic anxiety and depression.  8. Osteoarthritis with remote left total knee replacement.   HISTORY OF PRESENT ILLNESS:  Diana [name] is a 77 year old white female  with squamous cell carcinoma of the right mandible, T4, N0, status post  trach, right selective node dissection, right mandibulectomy, iliac crest  flap, April 2004, now status post seven weeks of external beam radiation  therapy, having finished last week.  She was hypotensive and saw the  radiation therapist last week, and she was placed, for the day, in the  hospital and given IV fluids for an hour and a half.   She represents on the day of admission with severe fatigue, lethargy,  failure to thrive, and blood pressures with systolic blood pressures down to  70, and, upon evaluation in the emergency room, where she was noted to be  markedly volume depleted and admitted for further management.  For details,  see the dictated summary on the chart.   LABORATORY DATA:  Chest x-ray:  No acute cardiopulmonary disease.  EKG:  Normal sinus rhythm.  Normal EKG.  C. Difficile was negative.  Occult blood  was negative.  CBC:  Hemoglobin was 10.8, hematocrit 31.5, white blood  cell  count 6.1; platelet count was 281,000.  PT/INR upon presentation was 5.4.  Chemistries:  Sodium 131, potassium 4.9, chloride 104, CO2 25, BUN 32,  creatinine 1.1, calcium 8.3, protein 6.6, albumin 3.2, SGOT 25, SGPT 36, alk  phos 127, bilirubin 0.5, magnesium 2.2, phosphorus 3.5; TSH 2.16; cortisol  was normal at 6.9.  Urinalysis was negative.  Blood cultures were negative.  Stool cultures were negative.   HOSPITAL COURSE:  Patient was admitted.  No sign of sepsis.  All findings  compatible with volume depletion.  Despite some minimal findings in urine,  there were no urinary symptoms, and this was felt mainly a volume-related  issue.  This, in conjunction, with the oropharyngeal symptoms and low-grade  diarrhea.  Patient was aggressively hydrated with stabilization of blood  pressure.  Potassium was aggressively replenished.  Coumadin held, and PEG  placement, per GI consultation, given the difficulty patient was having with  adequate oral intake.  PEG was placed without difficulty, and PEG care and  feedings were initiated and instructed to family, to include both daughter  and husband.  She tolerated this quite well and continued to take in some  mild-to-modest amounts of p.o. intake, which fell below her caloric needs as  well.   Prior to discharge, she was ambulating in the hallway, and family competent  with PEG care, according to patient and staff.  She did have some low-grade  fevers, but chest x-ray was clear.  Abdominal exam was benign and pending at  the time of this dictation was a repeat chest x-ray and urine, to be done  prior to discharge.  This will be followed up as an outpatient with  interventions as needed.   The diarrhea was felt secondary to radiation, as all workup to include C.  difficile, C&S, and occult blood were negative.  This did respond to  Imodium.   Coumadin was re-initiated, and this will be continued as an outpatient.   Patient is discharged  in improved condition.  Family and she are competent  with PEG placement.  She is to take in three cans of Osmolite daily as well  as supplemental water to the tune of 100 cc q.i.d.  This is in addition to  her normal meals, which should meet her needs adequately.  She is taking  Imodium for her diarrhea.  Pending is a chest x-ray and urine to be done the  morning of this discharge prior to discharge.   DISCHARGE MEDICATIONS:  1. She is to resume home medications, to include her clorazepate 7.5 q.h.s.  2. Ambien p.r.n.  3. Synthroid 0.125 q.d.  4. Lisinopril 10 mg daily.  5. Coumadin 4 mg daily.  6. Eye drops as prior instructed and __________ taken at home.   FOLLOW UP:  She is to follow up with Radiation Oncology and Oncology as  instructed as well.                                               Geoffry Paradise, M.D.    RA/MEDQ  D:  09/18/2002  T:  09/18/2002  Job:  [id number]

## 2010-06-20 NOTE — Discharge Summary (Signed)
[place]  Patient:    Diana [name], Diana [name]               MRN: [id number] Adm. Date:  [id number] Disc. Date: 08/14/99 Attending:  Evern Core Dictator:   Bynum Bellows. Idacavage, P.A.C. CC:         Edwin L. Judie Grieve, M.D.             Rodrigo Ran, M.D.             Laurier Nancy, M.D.             Candy Sledge, M.D.                           Discharge Summary  DIAGNOSES: 1. Cerebrovascular accident. 2. Hypertension. 3. Hypothyroidism. 4. Anxiety. 5. Enterococcus urinary tract infection. 6. Dizziness.  HOSPITAL COURSE:  The patient is a 77 year old female originally admitted to [facility] with dizziness and nausea and vomiting by Dr. Judie Grieve.  She was seen by Dr. Noreene Filbert.  Carotid duplex was negative.  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  The patient had a history of posterior circulation TIAs.  Right vertebral narrowing was noted per MRA.  The patient was placed on heparin and converted to Coumadin.  She was admitted to [facility] inpatient rehab July 30, 1999, where she has participated in physical and occupational therapies.  On admission urine culture, she was noted to have enterococcus UTI and was treated with amoxicillin for this.  She did continue to have dizziness which limited her participation in therapies at that time and was placed on Antivert.  This seemed to alleviate some of her symptoms.  She has participated well in therapies.  At the time of discharge, she is noted to be supervision with her bed mobilities, modified independent with her transfers, able to ambulate 150 feet with a rolling walker at the supervision level.  DISCHARGE MEDICATIONS: 1. Antivert 25 mg twice a day. 2. Aspirin 81 mg daily. 3. Coumadin dose to be decided at discharge. 4. Prinivil 10 mg daily. 5. Levothroid 125 mcg daily.  She does require intermittent supervision.  She is asked  to follow a balanced diet.  She will have outpatient physical and occupational therapies. She will go to Dr. Algie Coffer office Monday for PT-INR and follow up with Dr. Waynard Edwards who will be taking over Dr. Algie Coffer patients.  CONDITION ON DISCHARGE:  Stable. DD:  08/13/99 TD:  08/13/99 Job: [id number]

## 2010-06-20 NOTE — Op Note (Signed)
[place]  Patient:    Diana [name], Diana [name]                    MRN: [id number] Proc. Date: 11/03/99 Adm. Date:  [id number] Attending:  Janalyn Rouse CC:         Richard A. Jacky Kindle, M.D.   Operative Report  PREOPERATIVE DIAGNOSIS:  Right breast mass, probable fibrocystic disease.  POSTOPERATIVE DIAGNOSIS:  Right breast mass, probable fibrocystic disease.  OPERATION:  Excision of right breast mass.  SURGEON:  Rose Phi. Maple Hudson, M.D.  ANESTHESIA:  MAC.  DESCRIPTION OF PROCEDURE:  The patient was placed on the operating table and the right breast prepped and draped in the usual fashion.  A curvilinear incision was outlined over the palpable mass at the 9 oclock position of the right breast.  The area was then infiltrated 1% Xylocaine with adrenaline.  An incision was made and sharp dissection of this mass area was excised.  There was a fair amount of fibrocystic disease in this and I think that is all the pathology will show.  Hemostasis was obtained with a cautery.  Subcuticular closure with 4-0 Monocryl and Steri-Strips carried out.  Dressing applied. The patient was transferred to the recovery room in satisfactory condition having tolerated the procedure well. DD:  11/03/99 TD:  11/03/99 Job: [id number]

## 2010-06-20 NOTE — Discharge Summary (Signed)
Diana [name], Diana [name]             ACCOUNT NO.:  [id number]   MEDICAL RECORD NO.:  [id number]          PATIENT TYPE:  INP   LOCATION:  6709                         FACILITY:  [facility]   PHYSICIAN:  Geoffry Paradise, M.D.  DATE OF BIRTH:  1925/11/13   DATE OF ADMISSION:  04/21/2006  DATE OF DISCHARGE:  04/26/2006                         DISCHARGE SUMMARY - REFERRING   DISCHARGE DIAGNOSES:  1. Left hemipelvis fractures to include nondisplaced sacral fracture,      left nondisplaced inferior pubic rami fracture and small      nondisplaced left anterior acetabular fracture.  2. Atrial fibrillation on chronic Coumadin.  3. Essential hypertension.  4. Squamous cell carcinoma right mandible, status post resection and      radiation therapy.  5. Hypertension.  6. Chronic anxiety and depression.  7. Osteoarthritis and osteoporosis.  8. Hyperlipidemia.   HISTORY OF PRESENT ILLNESS:  Diana [name] is a pleasant 77 year old with  chronic atrial fibrillation on anticoagulation, right mandibular  carcinoma following resection and radiation therapy, hyperlipidemia,  chronic anxiety and chronic pain presenting at this time following a  fall with left thigh pain.  She has fairly chronic malaise and failure  to thrive symptoms but has been relatively stable.  She most likely as  well, has a very mild cognitive loss as well, but evidently sustained a  fall, landing on her left side and resultant left thigh pain.  She was  taken to the emergency room by EMS and found to have left hemipelvic  fractures but not able to be cared for at home, therefore, admitted for  pain control.  For further details, see the dictated summary on the  chart.   LABORATORY DATA:  Chest x-ray with COPD, no acute process.  CT left hip  with fracture anterior aspect of left acetabulum, fracture left inferior  pubic ramus, fracture left sacrum.   Urine culture:  20,000 colonies not significant.  PT/INR 2.  CBC with  hemoglobin 13.1, hematocrit 38, white blood cell count 7.2, platelet  count 186,000.  Chemistries with sodium 135, potassium 4.4, chloride  104, glucose 105, BUN 16, creatinine 0.5, calcium 8.8.  CK 46 and 56,  respectively.  Vitamin D level is pending.  Creatinine 0.7.   HOSPITAL COURSE:  Patient was admitted, placed on IV fluids, placed on  analgesics and bed rest.  Coumadin was continued as based upon her  chronic need.  This is the anticoagulation of choice and should take  care of DVT prophylaxis as well.  Dr. Valma Cava of [facility] was consulted, who reaffirmed the pelvic fractures, no need  of surgical interventions and 50% weightbearing for comfort for the  first four weeks, then weightbearing as tolerated with new x-rays at  that time.  Healing should be approximately eight weeks per his  recommendation.  She was supported throughout her hospital stay and is  discharged in improved and stable condition.  She clearly is not safe to  return home and is not ambulatory, therefore, she is discharge in stable  condition for rehabilitative intervention OT and PT with a walker 50%  weightbearing at  this time.   DIET:  Regular diet.  Supplements as needed.  Nutrition is always  challenging with her.   DISCHARGE MEDICATIONS:  1. Synthroid 125 mcg daily.  2. Coumadin 3 mg daily with further to be monitored by pharmacy.  3. Lexapro 20 mg daily.  4. Pilocarpine 1% one drop each eye q.i.d.  5. Timoptic 0.5% one drop each eye b.i.d.  6. Calcium carbonate 500 with vitamin D one p.o. t.i.d.  7. Calcitonin nasal spray, one spray alternating nares daily.  8. MiraLax 17 g b.i.d.  9. Vitamin D 50,000 units 1 p.o. one time weekly every Monday.  10.Aricept 5 mg daily x2 weeks then 10 mg daily thereafter.  11.Simvastatin 20 mg daily.  12.Ambien 10 p.o. nightly p.r.n. insomnia.  13.Tylenol 650 q.4h. p.r.n. pain.  14.Vicodin 5/500 one p.o. q.4h. p.r.n. increasing pain.   She  will receive full OT and PT, 50% weightbearing at this time.  Will  require follow-up with Dr. Valma Cava, [facility], in  four weeks.           ______________________________  Geoffry Paradise, M.D.     RA/MEDQ  D:  04/26/2006  T:  04/26/2006  Job:  [id number]

## 2010-06-20 NOTE — Consult Note (Signed)
[place]  Patient:    Diana [name], Diana [name]               MRN: [id number] Proc. Date: 07/25/99 Adm. Date:  [id number] Attending:  De Nurse CC:         Rana Snare. Judie Grieve, M.D.                          Consultation Report  CHIEF COMPLAINT:  Vertigo/rule out stroke.  HISTORY OF PRESENT ILLNESS:  Diana [name] is a 77 year old right handed married female. On the morning of May 22, 1999 she experienced some mild dizziness at home prior to leaving for work but this is nothing unusual for her.  While at work, she experienced the acute onset of spinning vertigo with nausea and vomiting.  She called her husband and as she was being helped out to the car, noted some weakness of her left foot. At one point, she had some tingling in her right hand involving a couple of fingers.  She is admitted by Dr. Judie Grieve to observation and advanced to hospitalization when her symptoms did not clear rapidly.  The patient is fairly comfortable when lying supine but has more dizziness when sitting up but has a tendency to fall to the right.  She denies changes in her vision or speech.  She denies incoordination or numbness.  Her left foot is still not "normal" when she is up on it.  There is no prior history of stroke.  PAST MEDICAL HISTORY: Positive for tonsillectomy and appendectomy.  She has had a benign breast cyst and goiter surgery.  She has had three foot surgeries and a urethral sling procedure which apparently failed. She has had right retinal hemorrhage.  MEDICATIONS: Prempro 2.5 mg, Synthroid 0.125 mg, Prinivil 20 mg p.o. q.d. and Tranxene p.r.n.  ALLERGIES:  She notes allergies to sulfa medications, codeine, Macrodantin and various antidepressants.  FAMILY HISTORY:  Reveals the patients father died of pneumonia and mother is deceased at age 48 with "renal poisoning."  SOCIAL HISTORY:  The patient is married and works as a Diplomatic Services operational officer for  [employer].  She does not smoke.  PHYSICAL EXAMINATION:  This is a well-developed, thin, pleasant lady in no acute distress lying nearly flat in bed.  The cranium is normocephalic and atraumatic.  The neck is supple and without bruits.  Heart reveals regular rate and rhythm without murmurs.  Lungs clear to auscultation.  Extremities without cyanosis, clubbing or edema.  Neurologic examination, the patient is alert and oriented.  Her speech is normal.  Cranial nerve examination reveals mild bilateral ptosis.  Extraocular movements are full and there is fine nystagmus on right lateral gaze. There is a slight rotatory component.  Visual fields are full to confrontation.  Pupils are equal and reactive to light.  There is slight facial asymmetry but apparently this is unchanged.  The tongue protrudes in the midline.  Motor testing reveals question of a very slight right pronator drift.  There is 5/5 strength through the upper and lower extremities. Deep tendon reflexes are 1+ in the upper extremities and trace in the lower extremities. Plantar responses are downgoing on the right and neutral on the left.  Sensory examination revealed decreased pinprick and temperature sensation in the left foot but was otherwise intact.  Cerebellar testing reveals poor rapid alternating movements and finger to nose.  When sitting up, the patient has definite tendency to fall  to the right.  Carotid Doppler studies show that the carotids and left vertebral are unremarkable.  Distal wave forms on the right vertebral are somewhat dampened.  IMPRESSION:  Question acute labyrinthitis - rule out brain stem/cerebellar stroke.  RECOMMENDATIONS:  Will order an MRI of the brain with MR angiography.  I would continue present treatment at this time.  We will follow up on her studies and clinical condition.  Appreciate the opportunity of evaluating this most pleasant lady. DD:  07/30/99 TD:  07/30/99 Job:  [id number]

## 2010-06-20 NOTE — Op Note (Signed)
[place]  Patient:    AMORI, COOPERMAN Visit Number: [id number] MRN: [id number]          Service Type: Attending:  Nadara Mustard, M.D. Proc. Date: 09/16/00                             Operative Report  PREOPERATIVE DIAGNOSIS:  Tricompartmental osteoarthritis left knee.  POSTOPERATIVE DIAGNOSIS: Tricompartmental osteoarthritis left knee.  OPERATION/PROCEDURE:  Left total knee arthroplasty with Osteonics Scorpio components -- posterior stabilized #7 patella femur and tibia with a 7 mm posterior stabilized poly tray.  SURGEON:  Nadara Mustard, M.D.  ANESTHESIA:  General endotracheal anesthesia.  ESTIMATED BLOOD LOSS:  Minimal.  ANTIBIOTICS:  One gram of Kefzol.  TOURNIQUET TIME:  84 minutes at 300 mmHg.  DISPOSITION:  To PACU in stable condition.  INDICATIONS FOR PROCEDURE:  The patient is a 77 year old woman with tricompartmental osteoarthritis who has failed conservative care.  She presents at this time for surgical intervention.  The risks and benefits were discussed.  The patient states she understands and wishes to proceed at this time.  DESCRIPTION OF PROCEDURE:  The patient was brought to OR #5 and underwent a general anesthetic.  After adequate level of anesthesia was obtained, the patients left lower extremity was prepped using DuraPrep and draped in the sterile field and Ioban was used to cover all exposed skin.  The knee was flexed and a longitudinal midline incision was made over the patella.  The medial parapatellar retinacular incision was made.  This was carried down through the capsule.  The patella was reflected laterally.  The drill hole was started for the femoral guide wire.  The femoral guide wire was placed with 5 degrees of valgus.  The 10 mm cutting block was placed and an additional 10 mm was taken.  The distal cut was taken.  This was then sized for a #7 and the 7 mm cutting block was made 7 mm cutting block  chamfers were then made. Attention was focused on the tibia.  This was sized for a #7 tibial tray.  The external alignment guide was used with a 5 degree posterior slope 4 mm plus an additional 2 mm was taken off the tibia.  This was checked with the external alignment guide.  The chamfer cuts were then made on the femoral component and the patella measured 25 mm and had approximately 12 mm of the patella taken for a 10 mm poly.  The component trials were then placed for the tibia and the femur.  The knee was placed through a range of motion.  Alignment was again checked.  The tibia was marked and the key hole punches were then made for the tibia.  The drill holes for the patella were then made.  The wound was irrigated.  A posterior capsule was released.  She did have a flexion contracture.  The knee was able to achieve full extension after the posterior release.  Loose bodies were removed.  Again, the wound was irrigated with pulse lavage.  The tibia component was cemented.  The femoral component was press-fit.  The patella was cemented and the poly tray was inserted.  The knee was held in extension while the cement hardened.  The wound was irrigated with pulse lavage during this time.  The knee did have a lateral contracture in the patella and a lateral release was performed for the patella.  The patella tracked well.  The deep fascia was closed using #1 Vicryl. Subcutaneous tissue was closed using 2-0 Vicryl.  Skin was closed using Proximate staples.  The wound was covered with Adaptic orthopedic sponges, sterile Webril and an Ace wrap from toes to thigh.  The patient was placed in an easy wrap ice pack as well as knee immobilizer.  She was extubated and taken to the PACU in stable condition.  Plans are for rehab stay. DD:  09/16/00 TD:  09/16/00 Job: [id number]

## 2010-06-20 NOTE — Discharge Summary (Signed)
NAMEJERICHO, CIESLIK             ACCOUNT NO.:  [id number]   MEDICAL RECORD NO.:  [id number]          PATIENT TYPE:  INP   LOCATION:  1303                         [facility]ACILITY[facility]  [facility]   PHYSICIAN:  Geoffry Paradise, M.D.  DATE OF BIRTH:  26-May-1925   DATE OF ADMISSION:  04/18/2005  DATE OF DISCHARGE:  04/20/2005                                 DISCHARGE SUMMARY   DISCHARGE DIAGNOSES:  1.  Urinary tract infection.  2.  Confusion secondary to urinary tract infection.  3.  Essential hypertension.  4.  Mild dementia.  5.  Chronic atrial fibrillation rate controlled on chronic Coumadin.  6.  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  7.  Hyperlipidemia.   HISTORY OF PRESENT ILLNESS:  Ms. Lowden is a very pleasant 77 year old  well-known to myself with cognitive deficits, atrial fibrillation on chronic  anticoagulation, squamous cell carcinoma right mandible, hypothyroidism,  hyperlipidemia presenting at this time with escalating confusion. She has  been in baseline health, seen as recently as March 9 by our Pharm D for  Coumadin management. She as well, had an epidural steroid injection and  block by Dr. Ethelene Hal in recent weeks but day prior to admission became a bit  more confused. She has been on some medication because of chronic pain  syndrome, but clearly the confusion was disproportionate and she was brought  to the emergency room for further management. For details see the dictated  summary by my partner Dr. Timothy Lasso on the chart.   LABORATORY DATA:  Urine positive nitrate, toxicology screen positive benzo's  and positive opiates which she is prescribed. Alcohol level less than 5.  Chemistries, sodium 136, potassium 4.2, chloride 98, CO2 30, glucose 124,  BUN 16, creatinine 0.7, calcium 9.6, protein 7.3, albumin 3.7, SGOT 22, SGPT  17, alk phos 92, bilirubin 1.0. INR was 2.4. CBC, hemoglobin 13.2,  hematocrit 39.9,  white blood cell count is 5.4, platelet count 272,000. She  had 81% neutrophils. Blood cultures negative. Urine greater than 100,000  colonies of gram-negative rods. Final C&S pending. TSH 0.096.   HOSPITAL COURSE:  The patient was admitted with the presumptive diagnosis of  urinary tract infection by UA and preliminary culture is growing gram-  negative rods. She was hydrated and continued on her usual medications.  Chest x-ray revealed no acute cardiopulmonary disease. CT scan of the brain  revealed no bleed. She was placed on Cipro, did extremely well and on the  morning of discharge is mentally clear, taking p.o.'s well and back to  baseline. I did have a discussion with both husband and daughter at the  bedside regarding urinary tract infections and the propensity for confusion.  She clearly had no evidence of a CNS process. Neurologic exam reveals her to  be at baseline at the time of this dictation. Chest remains clear. Vital  signs stable and she is afebrile. She is to be converted to oral medications  and discharged today for further home management.   The patient is discharged on a regular  diet.   DISCHARGE MEDICATIONS:  Medications include:  1.  Clorazepate 7.5 daily.  2.  Ambien 5 p.o. q.h.s. p.r.n. insomnia.  3.  Zocor 20 p.o. q.d.  4.  Synthroid 0.125 daily.  5.  Coumadin per pharmacy protocol.  6.  Lexapro 10 mg daily.  7.  Aricept 5 mg q.h.s.  8.  Lumigan 1 drop 0.03% q.h.s. each eye.  9.  Pilocarpine 1 drop left eye q.i.d.  10. Timoptic 1 drop 0.5% b.i.d. each eye.  11. MS Contin 30 mg q.12 h for chronic pain.  12. Cipro 500 p.o. b.i.d. x7 additional days.   She is to follow-up with Dr. Jacky Kindle in approximately 2 weeks.           ______________________________  Geoffry Paradise, M.D.     RA/MEDQ  D:  04/20/2005  T:  04/20/2005  Job:  [id number]

## 2010-06-20 NOTE — Discharge Summary (Signed)
[facility]  Patient:    Diana [name], Diana [name]               MRN: [id number] Adm. Date:  [id number] Disc. Date: 07/30/99 Attending:  Evern Core CC:         [facility] [place]             Candy Sledge, M.D.                           Discharge Summary  HISTORY:  This 77 year old white female, healthy, regularly working, hormone replacement therapy, controlled hypothyroidism, and hypertension, presented to the office after an acute onset of dizziness, vertigo, and vomiting.  She could not be stabilized in four hours of office observation, was brought into the hospital on observation status, continued highly symptomatic and was moved to full admission status on June 20.  There was no previous similar episode.  PHYSICAL EXAMINATION:  On admission:  GENERAL:  Showed the patient to be well-nourished, well-developed, appearing younger than her stated age.  She was acutely ill and uncomfortable.  She had observed retching and vomiting.  Any attempt to sit or move her head aggravated major dizziness, vertigo-the patient unable to sit or stand unsupported.  VITAL SIGNS:  Blood pressure 148/80, temperature 97.0, pulse 80, respirations 14.  HEENT:  There were full extraocular movement, possibly some terminal nystagmus to the left.  The mucous membranes remained moist despite earlier vomiting. NECK:  There was scar of previous thyroidectomy.  No carotid bruits.  HEART:  Sinus rhythm.  EXTREMITIES:  Initially no lateralizing motor, sensory, or reflex changes were noted.  It should have been mentioned that the patient did state some subjective numbness of the right thumb, possibly of the left foot.  LABORATORY DATA:  Hemoglobin 13.1, hematocrit 39.7, WBC 9300, with platelets 283,000.  Urinalysis was normal.  Comprehensive metabolic profile showed normal values throughout.  The PP, PT, and PTT values on the chart  has heparin, Coumadin monitored.  Neck Doppler examination showed no evidence of significant internal artery stenosis with vertebral artery flow, antegrade. There was some dampening of the vertebral wave form on the right, suggestive of some distal occlusion or stenosis.  A CT head scan showed some moderate diffuse atrophy, but not acute process.  An MRI showed hyperintense signal involving the inferior cerebellar peduncle of the medulla on the right-this being in the region of the vestibular nucleus and consistent with acute right lateral medullary infarction.  An MRA showed narrowing of the vertebral and basilar arteries in a diffuse fashion, felt to represent an anatomic variation, as opposed to a vasculopathy.  COURSE IN HOSPITAL:  The patient was admitted, had supplemental IV fluids, antiemetics, meclizine, and initially aspirin.  A Foley catheter was required. The morning after admission, although clear of acute nausea and vomiting, she still had major balance disturbance, unable to sit or stand.  Full admission status was required.  She was seen in consultation by Dr. Shireen Quan of neurology service, who ordered MRI/MRA.  This confirmed the medullary CVA. Although the patient quickly resumed her ability to maintain adequate food and fluid intake, she continued with major balance disturbance.  Physical therapy was involved.  She could not be improved to the point of return home and she was accepted as a candidate for inpatient rehabilitation.  At the recommendation of Dr. Melbourne Abts, who saw her in neurologic follow-up, she was begun  on heparin therapy to be transitioned to Coumadin.  At the time of transfer, the prothrombin time, was INR 1.4-not yet in full therapeutic range, so that she persisted with heparin at transfer.  Her blood pressures remained normal so that it was not necessary to resume her preadmission Prinivil 20 mg. It is likely that as condition improves and she  becomes more active, that this medication will have to be re-initiated.  MEDICATION REGIMEN:  At discharge: 1. Levothroid 0.125 mg daily. 2. Prempro 2.5 daily. 3. Meclizine 25 mg each 8h. 4. Lorazepam 1 mg h.s. p.r.n. 5. Heparin and Coumadin as directed by pharmacy.  Dosage at transfer    being heparin 850 units per hour, Coumadin 6 mg ordered for evening    of discharge.  CONDITION ON DISCHARGE:  Improved.  FINAL DIAGNOSES: 1. Cerebrovascular accident-right medulla as manifested by dizziness/vertigo,    truncal ataxia to include inability to sit or stand unsupported. 2. Hypertension. 3. Hypothyroidism. 4. Hormone replacement therapy. DD:  07/31/99 TD:  07/31/99 Job: [id number]

## 2010-06-20 NOTE — H&P (Signed)
NAME:  Diana [name], Diana [name]                       ACCOUNT NO.:  [id number]   MEDICAL RECORD NO.:  [id number]                   PATIENT TYPE:  INP   LOCATION:  0105                                 FACILITY:  [facility]   PHYSICIAN:  Gwen Pounds, M.D.                 DATE OF BIRTH:  [birth year]   DATE OF ADMISSION:  09/10/2002  DATE OF DISCHARGE:                                HISTORY & PHYSICAL   CHIEF COMPLAINT:  Hypotension.   HISTORY OF PRESENT ILLNESS:  This is a 77 year old female with squamous cell  carcinoma of the right mandible T4 N0, status post tracheostomy, right  selective neck dissection, right mandibulectomy with iliac crest free flap  in April 2004.  She is now status post seven weeks of XRT finished last  week.  She saw the radiation oncologist on Friday with hypotension. He put  her in the day hospital and gave her IV fluids over an hour and a half  approximately about one liter.  She did well until today.  Her daughter  called at 3 p.m. on Sunday with her Mom with severe fatigue and lethargy,  failure to thrive and hypotensive.  They checked blood pressures at home and  they were 77/50, 73/47, 76/47 with heart rates of 53 to 67.  She had  diarrhea approximately one week ago which lasted for one week; the last  episode was Thursday.  No nausea and no vomiting.  She has been trying to  drink Boost.  It is difficult to tolerate anything else secondary to  mucositis.  Some mild confusion in the ER.  She is admitted for evaluation  and treatment.   PAST MEDICAL HISTORY:  1. Squamous cell carcinoma of the right mandible status post surgery and     radiation treatment.  2. Glaucoma.  3. 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  4. Hypertension.  5. Hypothyroid status post thyroidectomy.  6. History of cerebrovascular accident with left arm decreased sensation.  7. Anxiety.  8. Urinary tract infection.  9. Left total knee replacement.  10.      Tonsillectomy and adenoidectomy.  11.      Cataract  surgery bilaterally.   ALLERGIES:  1. SULFA.  2. CODEINE.  3. MACRODANTIN.  4. TAPE.   MEDICATIONS:  1. Coumadin 4 mg p.o. daily.  Her last INR was checked on August 15, 2002 and     it was 2.0.  2. Clorazepate 7.5 mg p.o. daily.  3. Ambien 5 mg q.h.s.  4. Levothyroxine 0.125 mg daily.  5. Lisinopril 10 mg daily.  6. Prozac 20 mg daily.  7. Zocor 20 mg daily.  8. Morphine (MS Contin) 15 mg q.12h.  9. Magic mouthwash.  10.      Hydrocortisone cream for the face.   FAMILY HISTORY:  Mother died in her 24's of kidney problems. Dad died at 41  of pneumonia.   SOCIAL HISTORY:  She is retired.  No tobacco.  Rare alcohol.   REVIEW OF SYMPTOMS:  GENERAL:  Poor p.o. intake, 20 pound weight loss  recently.  HEENT:  Mouth sores and significant mouth pain.  The right side  of her face is red and swollen secondary to radiation.  GI/GU:  She has  decreased ability to void urine or bowel movements over the last two to  three days.  NEUROLOGIC:  Some confusion per the family and thus feeling  pretty miserable.  All other systems are reviewed and are negative.  CARDIOVASCULAR:  She denies any chest pain, shortness of breath or any  cardiac or lung involvement.   PHYSICAL EXAMINATION:  VITAL SIGNS:  Blood pressure - there is one normal  blood pressure at 100/57 and the rest of the blood pressures have been 67/48  and 71/51, heart rate 52, respiratory rate 14, saturating 95% on room air.  GENERAL:  Awake, alert and oriented times three, very sick appearing,  malnourished appearing.  HEENT:  The right cheek is swollen, minimal redness without warmth or  cellulitis of the cheek.  Surgical scar is noted.  Oropharynx is dry with  significant mucositis noted.  Neck:  No lymphadenopathy.  PULMONARY: Clear to auscultation bilaterally.  ABDOMEN:  Soft, non-tender and non-distended.  Bowel sounds are positive.  CARDIAC:  Regular.  EXTREMITIES:  No edema.   ANCILLARY DATA:  Chest x-ray shows no acute  cardiopulmonary disease.  EKG  shows normal sinus rhythm and plenty of artifact.   ASSESSMENT:  This is a 77 year old female on Coumadin for a history of  strokes and also has a history of squamous cell carcinoma of the right  mandible status post extensive surgery and several weeks of radiation who  presents with malnutrition, dehydration, a 20 pound weight loss, mucositis  and hypotension requiring admission.   PLAN:  1. Admit to the step-down unit.  2. Hypotension secondary to hypovolemia - aggressive IV fluid replacement,     hold lisinopril, check blood cultures.  No fevers or obvious signs or     symptoms of infection at this time.  We will hold antibiotics for now.  3. Mucositis - aggressive mouth care.  4. Malnutrition - full liquids, Boost and Ensure. Consider Dobhoff or Panda     tube in the a.m. if can get through the mucositis.  5. Control pain.  6. Keep INR 2.0 to 3.0.  7. We will check a cortisol level.  8. Check liver function tests, TSH level and continue Levothyroxine.  9. We will hold Ambien and other anti-anxiety medications due to some recent     confusion and hypotension at this current time.  10.      We will treat pain only if necessary.                                               Gwen Pounds, M.D.    JMR/MEDQ  D:  09/10/2002  T:  09/10/2002  Job:  [id number]   cc:   Eber Hong  [address]  [place]  [place] [address]  Fax: [phone]   Molli Hazard A. Kathrynn Running, M.D.  [address] [facility]  [place]  [place]  [address]  Fax: [phone]   Nadara Mustard, M.D.  [address]  [place]  [place] [address]  Fax: [phone]

## 2010-11-08 ENCOUNTER — Emergency Department (HOSPITAL_COMMUNITY)
Admission: EM | Admit: 2010-11-08 | Discharge: 2010-11-08 | Disposition: A | Discharge status: Home / Self Care | Payer: Medicare Other | Attending: Emergency Medicine | Admitting: Emergency Medicine

## 2010-11-08 ENCOUNTER — Emergency Department (HOSPITAL_COMMUNITY): Payer: Medicare Other

## 2010-11-08 DIAGNOSIS — Y9210 Unspecified residential institution as the place of occurrence of the external cause: Secondary | ICD-10-CM | POA: Insufficient documentation

## 2010-11-08 DIAGNOSIS — Z8679 Personal history of other diseases of the circulatory system: Secondary | ICD-10-CM | POA: Insufficient documentation

## 2010-11-08 DIAGNOSIS — M545 Low back pain, unspecified: Secondary | ICD-10-CM | POA: Insufficient documentation

## 2010-11-08 DIAGNOSIS — I10 Essential (primary) hypertension: Secondary | ICD-10-CM | POA: Insufficient documentation

## 2010-11-08 DIAGNOSIS — S0003XA Contusion of scalp, initial encounter: Secondary | ICD-10-CM | POA: Insufficient documentation

## 2010-11-08 DIAGNOSIS — M25559 Pain in unspecified hip: Secondary | ICD-10-CM | POA: Insufficient documentation

## 2010-11-08 DIAGNOSIS — W06XXXA Fall from bed, initial encounter: Secondary | ICD-10-CM | POA: Insufficient documentation

## 2010-11-08 DIAGNOSIS — M47812 Spondylosis without myelopathy or radiculopathy, cervical region: Secondary | ICD-10-CM | POA: Insufficient documentation

## 2010-11-08 DIAGNOSIS — I4891 Unspecified atrial fibrillation: Secondary | ICD-10-CM | POA: Insufficient documentation

## 2010-11-08 LAB — PROTIME-INR: Prothrombin Time: 14.2 seconds (ref 11.6–15.2)

## 2010-11-27 ENCOUNTER — Emergency Department (HOSPITAL_COMMUNITY): Payer: Medicare Other

## 2010-11-27 ENCOUNTER — Inpatient Hospital Stay (HOSPITAL_COMMUNITY)
Admission: EM | Admit: 2010-11-27 | Discharge: 2010-12-02 | DRG: 603 | Disposition: A | Discharge status: Home Health | Payer: Medicare Other | Attending: Internal Medicine | Admitting: Internal Medicine

## 2010-11-27 DIAGNOSIS — D649 Anemia, unspecified: Secondary | ICD-10-CM | POA: Diagnosis present

## 2010-11-27 DIAGNOSIS — B961 Klebsiella pneumoniae [K. pneumoniae] as the cause of diseases classified elsewhere: Secondary | ICD-10-CM | POA: Diagnosis present

## 2010-11-27 DIAGNOSIS — I4891 Unspecified atrial fibrillation: Secondary | ICD-10-CM | POA: Diagnosis present

## 2010-11-27 DIAGNOSIS — L02419 Cutaneous abscess of limb, unspecified: Principal | ICD-10-CM | POA: Diagnosis present

## 2010-11-27 DIAGNOSIS — L03119 Cellulitis of unspecified part of limb: Principal | ICD-10-CM | POA: Diagnosis present

## 2010-11-27 DIAGNOSIS — R7402 Elevation of levels of lactic acid dehydrogenase (LDH): Secondary | ICD-10-CM | POA: Diagnosis present

## 2010-11-27 DIAGNOSIS — E039 Hypothyroidism, unspecified: Secondary | ICD-10-CM | POA: Diagnosis present

## 2010-11-27 DIAGNOSIS — F0390 Unspecified dementia without behavioral disturbance: Secondary | ICD-10-CM | POA: Diagnosis present

## 2010-11-27 DIAGNOSIS — S0003XA Contusion of scalp, initial encounter: Secondary | ICD-10-CM | POA: Diagnosis present

## 2010-11-27 DIAGNOSIS — W010XXA Fall on same level from slipping, tripping and stumbling without subsequent striking against object, initial encounter: Secondary | ICD-10-CM | POA: Diagnosis present

## 2010-11-27 DIAGNOSIS — H409 Unspecified glaucoma: Secondary | ICD-10-CM | POA: Diagnosis present

## 2010-11-27 DIAGNOSIS — K869 Disease of pancreas, unspecified: Secondary | ICD-10-CM | POA: Diagnosis present

## 2010-11-27 DIAGNOSIS — N390 Urinary tract infection, site not specified: Secondary | ICD-10-CM | POA: Diagnosis present

## 2010-11-27 DIAGNOSIS — R7401 Elevation of levels of liver transaminase levels: Secondary | ICD-10-CM | POA: Diagnosis present

## 2010-11-27 DIAGNOSIS — Z7901 Long term (current) use of anticoagulants: Secondary | ICD-10-CM

## 2010-11-27 DIAGNOSIS — Y9210 Unspecified residential institution as the place of occurrence of the external cause: Secondary | ICD-10-CM | POA: Diagnosis present

## 2010-11-27 DIAGNOSIS — I10 Essential (primary) hypertension: Secondary | ICD-10-CM | POA: Diagnosis present

## 2010-11-27 LAB — DIFFERENTIAL
Basophils Absolute: 0 10*3/uL (ref 0.0–0.1)
Eosinophils Absolute: 0.2 10*3/uL (ref 0.0–0.7)
Lymphocytes Relative: 23 % (ref 12–46)
Lymphs Abs: 1.2 10*3/uL (ref 0.7–4.0)
Monocytes Relative: 9 % (ref 3–12)

## 2010-11-27 LAB — COMPREHENSIVE METABOLIC PANEL
ALT: 221 U/L — ABNORMAL HIGH (ref 0–35)
AST: 350 U/L — ABNORMAL HIGH (ref 0–37)
Albumin: 3.3 g/dL — ABNORMAL LOW (ref 3.5–5.2)
BUN: 24 mg/dL — ABNORMAL HIGH (ref 6–23)
Creatinine, Ser: 0.74 mg/dL (ref 0.50–1.10)
GFR calc Af Amer: 89 mL/min — ABNORMAL LOW (ref >90)
Potassium: 4.3 mEq/L (ref 3.5–5.1)
Sodium: 139 mEq/L (ref 135–145)

## 2010-11-27 LAB — CBC
HCT: 34 % — ABNORMAL LOW (ref 36.0–46.0)
Hemoglobin: 10.7 g/dL — ABNORMAL LOW (ref 12.0–15.0)
MCH: 31.3 pg (ref 26.0–34.0)
MCHC: 31.5 g/dL (ref 30.0–36.0)
MCV: 99.4 fL (ref 78.0–100.0)
RDW: 13.4 % (ref 11.5–15.5)

## 2010-11-27 LAB — URINALYSIS, ROUTINE W REFLEX MICROSCOPIC
Hgb urine dipstick: NEGATIVE
Protein, ur: NEGATIVE mg/dL
Specific Gravity, Urine: 1.01 (ref 1.005–1.030)

## 2010-11-27 LAB — CARDIAC PANEL(CRET KIN+CKTOT+MB+TROPI)
Relative Index: INVALID (ref 0.0–2.5)
Troponin I: <0.3 ng/mL (ref <0.30)

## 2010-11-27 LAB — URINE MICROSCOPIC-ADD ON

## 2010-11-27 NOTE — H&P (Signed)
Diana [name], [name]             ACCOUNT NO.:  [id number]  MEDICAL RECORD NO.:  [id number]  LOCATION:  [facility]LED                         FACILITY:  [facility]  PHYSICIAN:  Debbora Presto, MD DATE OF BIRTH:  [birth year]  DATE OF ADMISSION:  11/27/2010 DATE OF DISCHARGE:                             HISTORY & PHYSICAL   CHIEF CONCERN:  Status post fall, left lower extremity cellulitis, right greater than left.  HISTORY OF PRESENT ILLNESS:  The patient is a very pleasant 77 year old female who presents to [facility] with a main area of falling and hitting her head.  She describes sitting in a recliner, trying to get to her walker, she was trying to get up, she fell and hit the top of her head on the chess board.  She reports 1 episode of fall approximately 3 months ago.  She lives currently at assisted living facility, [facility].  She denies recent sicknesses or hospitalization.  REVIEW OF SYSTEMS:  Denies cough, chest pain, or shortness of breath. No fevers.  No chills.  No abdominal or urinary concerns.  She endorses lower extremity pain which has been present for several months, constant, and for that reason she is using a walker for ambulation.  She denies other systemic symptoms.  No changes in appetite.  No weakness. No headaches.  No dizziness or palpitations.  Also denies orthopnea or PND.  HOME MEDICATIONS: 1. Coumadin 3-mg tablet, the patient is taking 1.5 mg tablet daily. 2. Xalatan 0.005% ophthalmic drops daily into both eyes. 3. Ambien 5 mg tablet daily at bedtime. 4. Mirtazapine 50 mg tablet daily at bedtime. 5. Benazepril 10 mg tablet daily at bedtime. 6. Clorazepate 7.5 mg tablet daily at bedtime. 7. Pilocarpine ophthalmic drops 1% one drop 4 times daily into both     eyes. 8. Timolol ophthalmic solution 0.5% in both eyes twice daily. 9. Simvastatin 20 mg tablet daily at bedtime. 10.Citalopram 20 mg tablet daily. 11.Levothyroxine 112 mcg p.o.  daily.  ALLERGIES:  SULFA, CODEINE, NITROFURANTOIN, LATEX, MACROLIDE, PERCOCET, OXYCODONE, AND ASPIRIN.  PAST MEDICAL HISTORY: 1. Left hemipelvis fracture, nondisplaced sacral fracture, and left     nondisplaced inferior pubic rami fractures, March 2008. 2. Atrial fibrillation on chronic Coumadin. 3. Essential hypertension. 4. History of squamous cell carcinoma, right mandible, status post     resection and radiation therapy. 5. Hypertension. 6. Chronic anxiety and depression. 7. Osteoarthritis and osteoporosis. 8. Hyperlipidemia.  PHYSICAL EXAMINATION:  VITAL SIGNS:  Temperature 98.1, pulse 72, blood pressure 152/77, respirations 16, and saturating 99% on room air. GENERAL:  The patient is very pleasant, lying in bed, cooperative to examination, follows commands appropriately, not in acute distress. HEENT:  Head atraumatic.  PERRLA.  EOMI.  Moist mucous membranes.  No oropharyngeal erythema. NECK:  Supple.  No thyroid enlargement.  Full range of motion. CVS:  Regular rate and rhythm.  S1 and S2 present.  No murmurs.  No carotid bruits.  No gallops auscultated.  No JVD. LUNGS:  Clear to auscultation bilaterally.  No wheezes, rhonchi, or rales. ABDOMEN:  Soft, nontender, and nondistended.  Bowel sounds present. EXTREMITIES:  Bilateral lower extremity pitting edema +1, bilateral extremities warm to touch and erythematous,  right greater than left, nontender to palpation, erythema extending from ankle to mid calf in both extremities. NEUROLOGIC:  The patient is alert and oriented x3.  Strength 5/5 in upper and lower extremities bilaterally.  Sensation intact to soft touch in upper and lower extremities bilaterally, cerebellar functions intact, and plantar is downgoing.  LABORATORY DATA:  WBC 5.3, hemoglobin 10.7, hematocrit 34, and platelets 285.  Sodium 139, potassium 4.3, chloride 104, bicarb 26, BUN 24, creatinine 0.74, glucose 89, bilirubin 0.3, alkaline phosphatase 153, AST  350, ALT 221, protein 6.6, albumin 3.3, and calcium 8.6.  CT of the head on November 27, 2010, no skull fractures or intracranial hemorrhage. Moderate small vessel disease type changes and global atrophy without hydrocephalus.  Chest x-ray from November 27, 2010, no evidence of acute cardiopulmonary disease, chronic interstitial markings.  ASSESSMENT AND PLAN: 1. Fall -- unclear etiology, questionable atrial fibrillation versus     orthostatic hypotension and dehydration.  Neurologic exam is     completely nonfocal at the moment.  The patient is also     asymptomatic at this time.  We will admit the patient to telemetry     unit and observe for 24 hours.  We will also check orthostatic     vitals and start gentle hydration with normal saline.  We will also     check cardiac enzymes, TSH.  We will place consult for physical     therapy.  Of note, review of the patient's home medications     indicates several medicines that the patient is getting at night     including Ambien, mirtazapine, clorazepate, and Aricept.  For now,     we will only continue Ambien as needed 5 mg tablet as well as     Aricept 10 mg tablet daily at bedtime.  We will hold off on     clorazepate and mirtazapine for now. 2. Lower extremity cellulitis -- right lower extremity greater than     left.  Physical exam findings suggestive of cellulitis; however,     the patient denies tenderness in the lower extremities.  We will     start vancomycin per pharmacy.  For now, we will continue with IV     fluids.  Vancomycin can be changed to p.o. antibiotics once the     patient is able to tolerate p.o.  We will also place the to keep     the extremities slightly elevated. 3. Atrial fibrillation.  Currently rate controlled and currently in     sinus rhythm.  The patient has been on chronic Coumadin.  She has     tolerated the treatment well; however, now questioning if she is a     good candidate to continue treatment given her  second episode of     fall.  For now, we will continue and let the primary team decide on     further management. 4. Hypertension, currently at goal. 5. Hyperlipidemia, continue simvastatin. 6. Hypothyroidism.  We will check TSH and continue levothyroxine per     home regimen. 7. Transaminitis.  Unclear etiology of elevated transaminases.     Looking at the previous records, it has shown that     the patient had always normal liver function tests.  The patient     has no right upper quadrant abdominal pain, tenderness.  We will     check right upper quadrant ultrasound for further evaluation.  TIME SPENT:  Over 30 minutes spent  on admitting the patient.     Debbora Presto, MD     IM/MEDQ  D:  11/27/2010  T:  11/27/2010  Job:  [id number]

## 2010-11-28 ENCOUNTER — Inpatient Hospital Stay (HOSPITAL_COMMUNITY): Payer: Medicare Other

## 2010-11-28 DIAGNOSIS — R609 Edema, unspecified: Secondary | ICD-10-CM

## 2010-11-28 LAB — CBC
MCH: 31.7 pg (ref 26.0–34.0)
Platelets: 280 10*3/uL (ref 150–400)
RBC: 3.19 MIL/uL — ABNORMAL LOW (ref 3.87–5.11)

## 2010-11-28 LAB — COMPREHENSIVE METABOLIC PANEL
AST: 123 U/L — ABNORMAL HIGH (ref 0–37)
Albumin: 2.9 g/dL — ABNORMAL LOW (ref 3.5–5.2)
Calcium: 9.1 mg/dL (ref 8.4–10.5)
Creatinine, Ser: 0.72 mg/dL (ref 0.50–1.10)
GFR calc non Af Amer: 78 mL/min — ABNORMAL LOW (ref >90)
Sodium: 137 mEq/L (ref 135–145)
Total Protein: 5.9 g/dL — ABNORMAL LOW (ref 6.0–8.3)

## 2010-11-28 LAB — CARDIAC PANEL(CRET KIN+CKTOT+MB+TROPI)
CK, MB: 1.9 ng/mL (ref 0.3–4.0)
Relative Index: INVALID (ref 0.0–2.5)
Total CK: 67 U/L (ref 7–177)
Troponin I: <0.3 ng/mL (ref <0.30)
Troponin I: <0.3 ng/mL (ref <0.30)

## 2010-11-28 LAB — TSH: TSH: 0.038 u[IU]/mL — ABNORMAL LOW (ref 0.350–4.500)

## 2010-11-28 LAB — PROTIME-INR
INR: 1.06 (ref 0.00–1.49)
Prothrombin Time: 14 seconds (ref 11.6–15.2)

## 2010-11-28 LAB — MRSA PCR SCREENING: MRSA by PCR: NEGATIVE

## 2010-11-28 MED ORDER — IOHEXOL 300 MG/ML  SOLN
80.0000 mL | Freq: Once | INTRAMUSCULAR | Status: AC | PRN
  Administered 2010-11-28: 80 mL via INTRAVENOUS

## 2010-11-29 ENCOUNTER — Other Ambulatory Visit (HOSPITAL_COMMUNITY): Payer: Medicare Other

## 2010-11-29 LAB — CBC
Hemoglobin: 10.4 g/dL — ABNORMAL LOW (ref 12.0–15.0)
MCHC: 32 g/dL (ref 30.0–36.0)
RDW: 13.5 % (ref 11.5–15.5)

## 2010-11-29 LAB — BASIC METABOLIC PANEL
Calcium: 8.9 mg/dL (ref 8.4–10.5)
Creatinine, Ser: 0.82 mg/dL (ref 0.50–1.10)
GFR calc Af Amer: 75 mL/min — ABNORMAL LOW (ref >90)

## 2010-11-29 LAB — URINE CULTURE
Colony Count: >=100000
Culture  Setup Time: 201210252018

## 2010-11-29 LAB — PROTIME-INR
INR: 0.94 (ref 0.00–1.49)
Prothrombin Time: 12.8 seconds (ref 11.6–15.2)

## 2010-11-29 LAB — IRON AND TIBC: Iron: 35 ug/dL — ABNORMAL LOW (ref 42–135)

## 2010-11-30 LAB — COMPREHENSIVE METABOLIC PANEL
ALT: 64 U/L — ABNORMAL HIGH (ref 0–35)
Albumin: 3.2 g/dL — ABNORMAL LOW (ref 3.5–5.2)
Alkaline Phosphatase: 122 U/L — ABNORMAL HIGH (ref 39–117)
Potassium: 3.9 mEq/L (ref 3.5–5.1)
Sodium: 132 mEq/L — ABNORMAL LOW (ref 135–145)
Total Protein: 6.8 g/dL (ref 6.0–8.3)

## 2010-11-30 LAB — CBC
HCT: 35.4 % — ABNORMAL LOW (ref 36.0–46.0)
MCV: 98.1 fL (ref 78.0–100.0)
RBC: 3.61 MIL/uL — ABNORMAL LOW (ref 3.87–5.11)
WBC: 5.1 10*3/uL (ref 4.0–10.5)

## 2010-12-01 LAB — CBC
HCT: 32.9 % — ABNORMAL LOW (ref 36.0–46.0)
Hemoglobin: 10.4 g/dL — ABNORMAL LOW (ref 12.0–15.0)
MCH: 31.2 pg (ref 26.0–34.0)
MCV: 98.8 fL (ref 78.0–100.0)
RBC: 3.33 MIL/uL — ABNORMAL LOW (ref 3.87–5.11)

## 2010-12-01 LAB — COMPREHENSIVE METABOLIC PANEL
Albumin: 2.9 g/dL — ABNORMAL LOW (ref 3.5–5.2)
Alkaline Phosphatase: 112 U/L (ref 39–117)
BUN: 41 mg/dL — ABNORMAL HIGH (ref 6–23)
Chloride: 99 mEq/L (ref 96–112)
GFR calc Af Amer: 75 mL/min — ABNORMAL LOW (ref >90)
Glucose, Bld: 99 mg/dL (ref 70–99)
Potassium: 4.5 mEq/L (ref 3.5–5.1)
Total Bilirubin: 0.2 mg/dL — ABNORMAL LOW (ref 0.3–1.2)

## 2010-12-02 LAB — PROTIME-INR: Prothrombin Time: 16.8 seconds — ABNORMAL HIGH (ref 11.6–15.2)

## 2010-12-02 NOTE — H&P (Signed)
Diana [name], Diana [name]             ACCOUNT NO.:  [id number]  MEDICAL RECORD NO.:  [id number]  LOCATION:  1431                         FACILITY:  [facility]  PHYSICIAN:  Debbora Presto, MD DATE OF BIRTH:  14-May-1925  DATE OF ADMISSION:  11/27/2010 DATE OF DISCHARGE:                             HISTORY & PHYSICAL   CHIEF CONCERN:  Status post fall, left lower extremity cellulitis, right greater than left.  HISTORY OF PRESENT ILLNESS:  The patient is a very pleasant 77 year old female who presents to [facility] with a main area of falling and hitting her head.  She describes sitting in a recliner, trying to get to her walker, she was trying to get up, she fell and hit the top of her head on the chess board.  She reports 1 episode of fall approximately 3 months ago.  She lives currently at assisted living facility, [facility].  She denies recent sicknesses or hospitalization.  REVIEW OF SYSTEMS:  Denies cough, chest pain, or shortness of breath. No fevers.  No chills.  No abdominal or urinary concerns.  She endorses lower extremity pain which has been present for several months, constant, and for that reason she is using a walker for ambulation.  She denies other systemic symptoms.  No changes in appetite.  No weakness. No headaches.  No dizziness or palpitations.  Also denies orthopnea or PND.  HOME MEDICATIONS: 1. Coumadin 3-mg tablet, the patient is taking 1.5 mg tablet daily. 2. Xalatan 0.005% ophthalmic drops daily into both eyes. 3. Ambien 5 mg tablet daily at bedtime. 4. Mirtazapine 50 mg tablet daily at bedtime. 5. Benazepril 10 mg tablet daily at bedtime. 6. Clorazepate 7.5 mg tablet daily at bedtime. 7. Pilocarpine ophthalmic drops 1% one drop 4 times daily into both     eyes. 8. Timolol ophthalmic solution 0.5% in both eyes twice daily. 9. Simvastatin 20 mg tablet daily at bedtime. 10.Citalopram 20 mg tablet daily. 11.Levothyroxine 112 mcg p.o.  daily.  ALLERGIES:  SULFA, CODEINE, NITROFURANTOIN, LATEX, MACROLIDE, PERCOCET, OXYCODONE, AND ASPIRIN.  PAST MEDICAL HISTORY: 1. Left hemipelvis fracture, nondisplaced sacral fracture, and left     nondisplaced inferior pubic rami fractures, March 2008. 2. Atrial fibrillation on chronic Coumadin. 3. Essential hypertension. 4. History of squamous cell carcinoma, right mandible, status post     resection and radiation therapy. 5. Hypertension. 6. Chronic anxiety and depression. 7. Osteoarthritis and osteoporosis. 8. Hyperlipidemia.  PHYSICAL EXAMINATION:  VITAL SIGNS:  Temperature 98.1, pulse 72, blood pressure 152/77, respirations 16, and saturating 99% on room air. GENERAL:  The patient is very pleasant, lying in bed, cooperative to examination, follows commands appropriately, not in acute distress. HEENT:  Head atraumatic.  PERRLA.  EOMI.  Moist mucous membranes.  No oropharyngeal erythema. NECK:  Supple.  No thyroid enlargement.  Full range of motion. CVS:  Regular rate and rhythm.  S1 and S2 present.  No murmurs.  Nocarotid bruits.  No gallops auscultated.  No JVD. LUNGS:  Clear to auscultation bilaterally.  No wheezes, rhonchi, or rales. ABDOMEN:  Soft, nontender, and nondistended.  Bowel sounds present. EXTREMITIES:  Bilateral lower extremity pitting edema +1, bilateral extremities warm to touch and erythematous, right  greater than left, nontender to palpation, erythema extending from ankle to mid calf in both extremities. NEUROLOGIC:  The patient is alert and oriented x3.  Strength 5/5 in upper and lower extremities bilaterally.  Sensation intact to soft touch in upper and lower extremities bilaterally, cerebellar functions intact, and plantar is downgoing.  LABORATORY DATA:  WBC 5.3, hemoglobin 10.7, hematocrit 34, and platelets 285.  Sodium 139, potassium 4.3, chloride 104, bicarb 26, BUN 24, creatinine 0.74, glucose 89, bilirubin 0.3, alkaline phosphatase 153, AST  350, ALT 221, protein 6.6, albumin 3.3, and calcium 8.6.  CT of the head on November 27, 2010, no skull fractures or intracranial hemorrhage. Moderate small vessel disease type changes and global atrophy without hydrocephalus.  Chest x-ray from November 27, 2010, no evidence of acute cardiopulmonary disease, chronic interstitial markings.  ASSESSMENT AND PLAN: 1. Fall, unclear etiology, questionable atrial fibrillation versus     orthostatic hypotension and dehydration.  Neurologic exam is     completely nonfocal at the moment.  The patient is also     asymptomatic at this time.  We will admit the patient to telemetry     unit and observe for 24 hours.  We will also check orthostatic     vitals and start gentle hydration with normal saline.  We will also     check cardiac enzymes, TSH.  We will place consult for physical     therapy.  Of note, review of the patient's home medications     indicates several medicines that the patient is getting at night     including Ambien, mirtazapine, clorazepate, and Aricept.  For now,     we will only continue Ambien as needed 5 mg tablet as well as     Aricept 10 mg tablet daily at bedtime.  We will hold off on     clorazepate and mirtazapine for now. 2. Lower extremity cellulitis, right lower extremity greater than     left.  Physical exam findings suggestive of cellulitis; however,     the patient denies tenderness in the lower extremities.  We will     start vancomycin per pharmacy.  For now, we will continue with IV     fluids.  Vancomycin can be changed to p.o. antibiotics once the     patient is able to tolerate p.o.  We will also place the to keep     the extremities slightly elevated. 3. Atrial fibrillation.  Currently rate controlled and currently in     sinus rhythm.  The patient has been on chronic Coumadin.  She has     tolerated the treatment well; however, now questioning if she is a     good candidate to continue treatment given her  second episode of     fall.  For now, we will continue and let the primary team decide on     further management. 4. Hypertension.  Currently at goal. 5. Hyperlipidemia.  Continue simvastatin. 6. Hypothyroidism.  We will check TSH and continue levothyroxine per     home regimen. 7. Transaminitis.  Unclear etiology of elevated transaminases.     Looking at the previous records, it has shown that     the patient had always normal liver function tests.  The patient     has no right upper quadrant abdominal pain, tenderness.  We will     check right upper quadrant ultrasound for further evaluation.  TIME SPENT:  Over 30 minutes spent on  admitting the patient.     Debbora Presto, MD     IM/MEDQ  D:  11/27/2010  T:  12/01/2010  Job:  [id number]  Electronically Signed by Debbora Presto MD on 12/02/2010 01:59:14 PM

## 2010-12-04 NOTE — Discharge Summary (Signed)
Diana [name], Diana [name]             ACCOUNT NO.:  [id number]  MEDICAL RECORD NO.:  [id number]  LOCATION:  1431                         FACILITY:  [facility]  PHYSICIAN:  Hillery Aldo, M.D.   DATE OF BIRTH:  Jun 06, 1925  DATE OF ADMISSION:  11/27/2010 DATE OF DISCHARGE:  12/01/2010                        DISCHARGE SUMMARY - REFERRING   DATE OF DISCHARGE:  December 01, 2010, pending bed availability at her assisted living facility.  PRIMARY CARE PHYSICIAN:  Florentina Jenny, MD.  She has also seen.  Dr. Geoffry Paradise.  DISCHARGE DIAGNOSES: 1. Mechanical fall, status post minor head trauma. 2. Hypothyroidism with low TSH, Synthroid adjusted. 3. Mild right lower extremity cellulitis. 4. Klebsiella pneumonia urinary tract infection. 5. Atrial fibrillation. 6. Hypertension. 7. Hyperlipidemia. 8. Transaminitis, improved status post discontinuation of statin     therapy. 9. An 8 mm cystic lesion in the pancreas, outpatient follow up with  MRI in 1 year recommended. 10.Normocytic anemia. 11.Dementia. 12.Glaucoma.  DISCHARGE MEDICATIONS: 1. Cipro 500 mg p.o. b.i.d. times 3 more days. 2. Ferrous sulfate 325 mg p.o. b.i.d. 3. Coumadin 5 mg p.o. daily, or as indicated by INR to maintain an INR     value of 2-3. 4. Levothyroxine, decreased to 100 mcg p.o. daily. 5. Ambien 5 mg p.o. at bedtime. 6. Clorazepate 7.5 mg p.o. at bedtime. 7. Donepezil 10 mg p.o. at bedtime. 8. Escitalopram 20 mg p.o. daily. 9. Mirtazapine 15 mg p.o. at bedtime. 10.Pilocarpine ophthalmic 1% 1 drop in both eyes q.i.d. 11.Timolol ophthalmic solution 0.5% 1 drop in both eyes b.i.d. 12.Xalatan 0.005% 1 drop in both eyes daily. Note: Simvastatin has been discontinued due to LFT elevation.  CONSULTATIONS: 1. Pharmacy for Coumadin dosing. 2. Physical therapy. 3. Social Work. 4. Occupational therapy.  BRIEF ADMISSION HISTORY OF PRESENT ILLNESS:  The patient is an 77 year old female who resides in an assisted  living facility and was brought to the hospital for right lower extremity swelling and bilateral erythema. She recently had a fall and suffered minor head trauma.  Upon initial evaluation in the emergency department, there was some concerns for cellulitis and she was referred to the hospitalist service for further evaluation and treatment.  For the full details, please see the dictated report done by Dr. Verta Ellen.  PROCEDURES AND DIAGNOSTIC STUDIES: 1. Chest x-ray on November 27, 2010, showed no evidence of acute     cardiopulmonary disease.  Chronic interstitial markings. 2. CT scan of the head on November 27, 2010, showed no skull fracture     or intracranial hemorrhage.  Moderate small vessel disease type     changes.  Global atrophy without hydrocephalus. 3. Abdominal ultrasound on November 28, 2010, showed a hypoechoic     lesion in the body of the pancreas, nonspecific, but could     represent a small pancreatic mass.  No intra or extrahepatic     biliary ductal dilatation.  No cholelithiasis. 4. CT scan of the abdomen on November 28, 2010, showed an 8 mm cystic     lesion in the region of the uncinate process of the pancreas with     apparent communication to the main pancreatic duct, consistent with     an  intraductal cystic mucinous neoplasm.  Cholelithiasis.     Gallbladder wall thickening and possible small amount of peri     cholecystic fluid. 5. Right lower extremity Doppler on November 28, 2010, showed no     evidence of DVT or superficial thrombosis involving the right lower     extremity and left common femoral vein.  No evidence of Baker cyst     on the right.  DISCHARGE LABORATORY VALUES:  Sodium was 134, potassium 4.5, chloride 99, bicarb 27, BUN 41, creatinine 0.82, glucose 99, total bilirubin 0.2, alkaline phosphatase 112, AST 29, ALT 53, total protein 6.3, albumin 2.9, calcium 9.1.  PT was 15.6 with an INR of 1.21.  White blood cell count was 5.9, hemoglobin  10.4, hematocrit 32.9, platelets 299.  An anemia panel showed an iron of 35, TIBC of 341, 10% saturation, UIBC 306, vitamin B12 of 271, serum folate 4.3, ferritin 29.  Urine cultures grew greater than 100,000 colonies of Klebsiella pneumonia.  TSH was low at 0.38.  HOSPITAL COURSE BY PROBLEM: 1. Mechanical fall:  The patient did have a CT scan of her head that     did not show any evidence of intracranial hemorrhage.  She was seen     and evaluated by both physical and occupational therapy.  She will     have home health PT and OT in her assisted living facility and a 3     in 1 has been ordered per their recommendations. 2. Hypothyroidism:  The patient was found to be over replaced given     her low TSH.  Her Synthroid dose was adjusted and she should have a     follow up TSH done in 4-6 weeks time. 3. Right lower extremity cellulitis/Klebsiella pneumonia urinary tract     infection.  The patient did have some swelling and erythema that     was greater on the right than the left.  These findings were     consistent with mild cellulitis.  She was initially put on     vancomycin.  This was discontinued and Cipro was ordered.  She will     complete a 7-day course of Cipro.  Of note, Dopplers were done to     rule out DVT and these were negative. 4. Atrial fibrillation:  The patient was found to have a     subtherapeutic INR on initial presentation.  She has been in normal     sinus rhythm on telemetry throughout her hospital stay.  Pharmacy     has been actively adjusting her Coumadin to achieve a therapeutic     INR.  Her home dose of Coumadin has been increased and she will     need close monitoring of her PT/INR, as an outpatient until she is     therapeutic.  Consideration for discontinuation of Coumadin therapy     given her recent fall can be made by her primary care physician. 5. Hypertension:  The patient's blood pressure is currently well     controlled. 6. Hyperlipidemia:  The  patient was on statin therapy and when she was     initially admitted, she had marked elevation of her LFTs with an     alkaline phosphatase of 125, AST of 123, and ALT of 133.  Her     statin therapy was subsequently held and her LFTs have trended down     and almost normalized.  Consideration for re-challenging with a  statin can be made by her primary care physician. 7. Elevated LFTs/pancreatic mass:  Because of the patient's LFT     elevation, an ultrasound of her abdomen was obtained which showed a     questionable pancreatic mass.  Subsequently, underwent CT scanning     of the abdomen, which confirmed an 8 mm cystic lesion in the     uncinate process of the pancreas.  A consequent discussion was held     with the radiologist and he recommended outpatient follow up with     an MRI in 1 year.  He stated that intraductal cystic mucinous     neoplasms are slow growing and, given the patient's age and     comorbidity, she would not likely be a candidate for surgical     resection.  He further stated that patients usually do not die from     this type of neoplasm. 8. Normocytic anemia:  The patient did have an anemia panel done which     showed iron deficiency.  She was placed on iron supplementation. 9. Dementia:  The patient was maintained on Aricept. 10.Glaucoma:  The patient was continued on her usual eyedrop     therapies.  DISPOSITION:  The patient is medically stable and will be discharged back to her assisted living facility.  CONDITION AT DISCHARGE:  Improved.  DISCHARGE INSTRUCTIONS:  Activity:  Increase activity slowly, walk with assistance with a walker.  Diet:  Low sodium, heart healthy.  Follow up with Dr. Redmond School or Dr. Jacky Kindle in 1 week.  Recommendations for followup:  Daily PT/INR checks with adjustment of Coumadin dosing until her INR is in the therapeutic range and stable. Follow up TSH in 4-6 weeks.  Follow up MRI of the abdomen in 1 year to assess  pancreatic mass.  Time spent coordinating care for discharge and discharge instructions including face-to-face time equals approximately 40 minutes.     Hillery Aldo, M.D.     CR/MEDQ  D:  12/01/2010  T:  12/01/2010  Job:  [id number]  cc:   Geoffry Paradise, M.D. Fax: [phone]  Florentina Jenny, MD Fax: [phone]  Electronically Signed by Hillery Aldo M.D. on 12/04/2010 03:08:35 PM

## 2011-10-01 ENCOUNTER — Emergency Department (HOSPITAL_COMMUNITY): Payer: Medicare Other

## 2011-10-01 ENCOUNTER — Encounter (HOSPITAL_COMMUNITY): Payer: Self-pay | Admitting: Emergency Medicine

## 2011-10-01 ENCOUNTER — Observation Stay (HOSPITAL_COMMUNITY)
Admission: EM | Admit: 2011-10-01 | Discharge: 2011-10-01 | Disposition: A | Discharge status: Home / Self Care | Payer: Medicare Other | Attending: Emergency Medicine | Admitting: Emergency Medicine

## 2011-10-01 DIAGNOSIS — R55 Syncope and collapse: Principal | ICD-10-CM | POA: Insufficient documentation

## 2011-10-01 DIAGNOSIS — N390 Urinary tract infection, site not specified: Secondary | ICD-10-CM | POA: Insufficient documentation

## 2011-10-01 DIAGNOSIS — F0390 Unspecified dementia without behavioral disturbance: Secondary | ICD-10-CM | POA: Insufficient documentation

## 2011-10-01 DIAGNOSIS — R404 Transient alteration of awareness: Secondary | ICD-10-CM | POA: Insufficient documentation

## 2011-10-01 HISTORY — DX: Essential (primary) hypertension: I10

## 2011-10-01 HISTORY — DX: Disorder of thyroid, unspecified: E07.9

## 2011-10-01 LAB — CBC WITH DIFFERENTIAL/PLATELET
Basophils Relative: 0 % (ref 0–1)
Eosinophils Absolute: 0.2 10*3/uL (ref 0.0–0.7)
Eosinophils Relative: 4 % (ref 0–5)
Hemoglobin: 12.6 g/dL (ref 12.0–15.0)
MCH: 33.8 pg (ref 26.0–34.0)
MCHC: 34.3 g/dL (ref 30.0–36.0)
Monocytes Absolute: 0.3 10*3/uL (ref 0.1–1.0)
Monocytes Relative: 7 % (ref 3–12)
Neutrophils Relative %: 66 % (ref 43–77)

## 2011-10-01 LAB — URINE MICROSCOPIC-ADD ON

## 2011-10-01 LAB — URINALYSIS, ROUTINE W REFLEX MICROSCOPIC
Bilirubin Urine: NEGATIVE
Glucose, UA: NEGATIVE mg/dL
Ketones, ur: NEGATIVE mg/dL
Protein, ur: NEGATIVE mg/dL

## 2011-10-01 LAB — BASIC METABOLIC PANEL
BUN: 13 mg/dL (ref 6–23)
Calcium: 9.6 mg/dL (ref 8.4–10.5)
Creatinine, Ser: 0.8 mg/dL (ref 0.50–1.10)
GFR calc Af Amer: 77 mL/min — ABNORMAL LOW (ref >90)
GFR calc non Af Amer: 66 mL/min — ABNORMAL LOW (ref >90)
Potassium: 4.9 mEq/L (ref 3.5–5.1)

## 2011-10-01 MED ORDER — SODIUM CHLORIDE 0.9 % IV BOLUS (SEPSIS)
1000.0000 mL | Freq: Once | INTRAVENOUS | Status: AC
  Administered 2011-10-01: 1000 mL via INTRAVENOUS

## 2011-10-01 MED ORDER — CEPHALEXIN 500 MG PO CAPS: 500.0000 mg | ORAL_CAPSULE | Freq: Three times a day (TID) | ORAL | Status: AC

## 2011-10-01 MED ORDER — CEPHALEXIN 250 MG PO CAPS
500.0000 mg | ORAL_CAPSULE | Freq: Once | ORAL | Status: AC
  Administered 2011-10-01: 500 mg via ORAL
  Filled 2011-10-01: qty 2

## 2011-10-01 NOTE — ED Notes (Addendum)
Pt pulled IV got and leads off, pt reports she needs to get her pyjamas on and go home.

## 2011-10-01 NOTE — ED Notes (Signed)
Pt in bed taking gown off, pt was re-dressed and was instructed to keep clothes on.

## 2011-10-01 NOTE — ED Notes (Signed)
Per EMS pt was being assisted with bathroom at [facility] and had syncapol episode.  Pt denies episode.  Staff reports pt hit head on wall.  EMS reports no obvious wounds.  Pt states she feels fine. Daughter wanted pt transported.  20G left hand 12 lead unremarkable CBG 86.  Pt alert oriented X4.  Previous stroke and has rt sided facial droop from previous stroke.

## 2011-10-01 NOTE — ED Notes (Signed)
Pt states she did not pass out, she did not hit her head. Pt claims she went tot the bathroom and rang the bell to get assistance as she always does.  Pt alert oriented X4.  Facial droop on rt side from previous stroke.

## 2011-10-01 NOTE — ED Provider Notes (Signed)
History     CSN: [id number]  Arrival date & time 10/01/11  1039   First MD Initiated Contact with Patient 10/01/11 1132      Chief Complaint  Patient presents with  . Loss of Consciousness    (Consider location/radiation/quality/duration/timing/severity/associated sxs/prior treatment) HPI Comments: Patient presents via EMS after a witnessed syncopal episode today. The nursing assistant reports that she was helping the patient use the bathroom when suddenly "her eyes rolled back in her head, she became unresponsive and the back of her head hit the wall." The witness reports the LOC time to be a few seconds. The witness reports the patient becoming lucid again and did not remember what happened. The patient's daughter is at the bedside who reports her mother having dementia. Patient does not have a cardiac history or previous syncopal episode according to the daughter.   Patient is a 78 y.o. female presenting with syncope.  Loss of Consciousness    No past medical history on file.  No past surgical history on file.  No family history on file.  History  Substance Use Topics  . Smoking status: Not on file  . Smokeless tobacco: Not on file  . Alcohol Use: Not on file    OB History    No data available      Review of Systems  Unable to perform ROS: Dementia  Cardiovascular: Positive for syncope.    Allergies  Codeine; Latex; Macrolides and ketolides; Nitrofurantoin; Oxycodone; Sulfa antibiotics; and Trimethoprim  Home Medications   Current Outpatient Rx  Name Route Sig Dispense Refill  . ACETAMINOPHEN 500 MG PO TABS Oral Take 500 mg by mouth 3 (three) times daily.    . ASPIRIN EC 81 MG PO TBEC Oral Take 81 mg by mouth daily.    Marland Kitchen VITAMIN D 1000 UNITS PO TABS Oral Take 1,000 Units by mouth daily.    Marland Kitchen CRANBERRY FRUIT 475 MG PO CAPS Oral Take 475 mg by mouth 2 (two) times daily.    Marland Kitchen DICLOFENAC SODIUM 1 % TD GEL Topical Apply 4 g topically 3 (three) times daily. Apply  to left knee    . ESCITALOPRAM OXALATE 20 MG PO TABS Oral Take 20 mg by mouth daily.    Marland Kitchen LATANOPROST 0.005 % OP SOLN Both Eyes Place 1 drop into both eyes at bedtime.    Marland Kitchen LEVOTHYROXINE SODIUM 100 MCG PO TABS Oral Take 100 mcg by mouth daily.    Marland Kitchen LISINOPRIL 10 MG PO TABS Oral Take 10 mg by mouth daily.    Marland Kitchen MELATONIN 3 MG PO CAPS Oral Take 3 mg by mouth at bedtime.    Marland Kitchen PILOCARPINE HCL 1 % OP SOLN Both Eyes Place 1 drop into both eyes 4 (four) times daily.    Marland Kitchen SACCHAROMYCES BOULARDII 250 MG PO CAPS Oral Take 250 mg by mouth 2 (two) times daily.    Marland Kitchen SIMVASTATIN 10 MG PO TABS Oral Take 10 mg by mouth at bedtime.    . EUCERIN EX CREA Topical Apply 1 application topically 2 (two) times daily.    Marland Kitchen TIMOLOL MALEATE 0.5 % OP SOLN Both Eyes Place 1 drop into both eyes 2 (two) times daily.    . TRAMADOL HCL 50 MG PO TABS Oral Take 25 mg by mouth every 6 (six) hours as needed. For pain      BP 141/78  Pulse 66  Resp 13  SpO2 98%  Physical Exam  Nursing note and vitals reviewed. Constitutional: She  is oriented to person, place, and time. She appears well-developed and well-nourished. No distress.  HENT:  Head: Normocephalic and atraumatic.  Mouth/Throat: No oropharyngeal exudate.       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 Right sided facial droop sue to previous stroke.   Eyes: Conjunctivae are normal. Pupils are equal, round, and reactive to light. No scleral icterus.  Neck: Normal range of motion. Neck supple.  Cardiovascular: Normal rate and regular rhythm.  Exam reveals no gallop and no friction rub.   No murmur heard. Pulmonary/Chest: Effort normal and breath sounds normal. No respiratory distress. She has no wheezes. She has no rales. She exhibits no tenderness.  Abdominal: Soft. There is no tenderness. There is no rebound.  Musculoskeletal: Normal range of motion.  Neurological: She is alert and oriented to person, place, and time. No cranial nerve deficit.  Skin: Skin is warm  and dry. No rash noted. She is not diaphoretic.  Psychiatric: She has a normal mood and affect. Her behavior is normal.       Patient has a history of dementia and denies passing out or LOC.     ED Course  Procedures (including critical care time)   Date: 10/01/2011  Rate: 70  Rhythm: accelerated junctional rhythm  QRS Axis: normal  Intervals: normal  ST/T Wave abnormalities: indeterminate  Conduction Disutrbances:none  Narrative Interpretation: accelerated junctional rhythm with artifact  Old EKG Reviewed: changes noted    Labs Reviewed  CBC WITH DIFFERENTIAL - Abnormal; Notable for the following:    RBC 3.73 (*)     All other components within normal limits  BASIC METABOLIC PANEL - Abnormal; Notable for the following:    Sodium 131 (*)     Chloride 95 (*)     GFR calc non Af Amer 66 (*)     GFR calc Af Amer 77 (*)     All other components within normal limits  POCT I-STAT TROPONIN I  URINALYSIS, ROUTINE W REFLEX MICROSCOPIC   Ct Head Wo Contrast  10/01/2011  *RADIOLOGY REPORT*  Clinical Data: Loss of consciousness  CT HEAD WITHOUT CONTRAST  Technique:  Contiguous axial images were obtained from the base of the skull through the vertex without contrast.  Comparison: Head CT 10/25 1012  Findings: No acute intracranial hemorrhage.  No focal mass lesion. No CT evidence of acute infarction.   No midline shift or mass effect.  No hydrocephalus.  Basilar cisterns are patent. Generalized cortical atrophy and periventricular white matter hypodensities unchanged from prior.  Paranasal sinuses and mastoid air cells are clear.  Orbits are normal. Cerumen impaction in the external auditory canals.  IMPRESSION:  1.  No acute intracranial findings. 2.  Atrophy and microvascular disease are similar to prior.   Original Report Authenticated By: Genevive Bi, M.D.      No diagnosis found.    MDM  1:21 PM Patient being evaluation for syncopal episode. Labs, head CT, EKG and troponin  ordered. Patient will most likely be admitted after testing has resulted.   4:28 PM Patient shows concerning orthostatic changes. I will give her fluids, wait for her urine to result, and move the patient to the CDU to recheck orthostatics after fluids. Patient signed out to Felicie Morn, PA-C.      Emilia Beck, PA-C 10/01/11 [phone]

## 2011-10-01 NOTE — ED Notes (Signed)
Pt reports she doesn't remember falling this morning.

## 2011-10-01 NOTE — ED Provider Notes (Signed)
Patient has completed IV fluids.  No current orthostasis.  UA reveals UTI.  Culture requested.  Allergic to multiple classes of antibiotics, but not cephalosporins.  Will give initial po dose of keflex.  Will discharge with prescription for continuation of keflex.  Jimmye Norman, NP 10/02/11 [phone]

## 2011-10-01 NOTE — ED Notes (Signed)
Talked to RN at [facility] to give her an update on pt.

## 2011-10-02 NOTE — ED Provider Notes (Signed)
Medical screening examination/treatment/procedure(s) were conducted as a shared visit with non-physician practitioner(s) and myself.  I personally evaluated the patient during the encounter   Loren Racer, MD 10/02/11 [phone]

## 2011-10-03 NOTE — ED Provider Notes (Signed)
Medical screening examination/treatment/procedure(s) were performed by non-physician practitioner and as supervising physician I was immediately available for consultation/collaboration.  Hurman Horn, MD 10/03/11 1754

## 2011-10-04 LAB — URINE CULTURE: Colony Count: >=100000

## 2012-01-27 ENCOUNTER — Emergency Department (HOSPITAL_COMMUNITY)
Admission: EM | Admit: 2012-01-27 | Discharge: 2012-01-27 | Disposition: A | Discharge status: Other Acute Inpatient Hospital | Payer: Medicare Other | Attending: Emergency Medicine | Admitting: Emergency Medicine

## 2012-01-27 ENCOUNTER — Encounter (HOSPITAL_COMMUNITY): Payer: Self-pay

## 2012-01-27 DIAGNOSIS — Z8673 Personal history of transient ischemic attack (TIA), and cerebral infarction without residual deficits: Secondary | ICD-10-CM | POA: Insufficient documentation

## 2012-01-27 DIAGNOSIS — F329 Major depressive disorder, single episode, unspecified: Secondary | ICD-10-CM | POA: Insufficient documentation

## 2012-01-27 DIAGNOSIS — E079 Disorder of thyroid, unspecified: Secondary | ICD-10-CM | POA: Insufficient documentation

## 2012-01-27 DIAGNOSIS — W19XXXA Unspecified fall, initial encounter: Secondary | ICD-10-CM

## 2012-01-27 DIAGNOSIS — Y9210 Unspecified residential institution as the place of occurrence of the external cause: Secondary | ICD-10-CM | POA: Insufficient documentation

## 2012-01-27 DIAGNOSIS — I10 Essential (primary) hypertension: Secondary | ICD-10-CM | POA: Insufficient documentation

## 2012-01-27 DIAGNOSIS — IMO0002 Reserved for concepts with insufficient information to code with codable children: Secondary | ICD-10-CM | POA: Insufficient documentation

## 2012-01-27 DIAGNOSIS — F3289 Other specified depressive episodes: Secondary | ICD-10-CM | POA: Insufficient documentation

## 2012-01-27 DIAGNOSIS — W050XXA Fall from non-moving wheelchair, initial encounter: Secondary | ICD-10-CM | POA: Insufficient documentation

## 2012-01-27 DIAGNOSIS — Z79899 Other long term (current) drug therapy: Secondary | ICD-10-CM | POA: Insufficient documentation

## 2012-01-27 DIAGNOSIS — Y939 Activity, unspecified: Secondary | ICD-10-CM | POA: Insufficient documentation

## 2012-01-27 DIAGNOSIS — Z049 Encounter for examination and observation for unspecified reason: Secondary | ICD-10-CM | POA: Insufficient documentation

## 2012-01-27 DIAGNOSIS — Z7982 Long term (current) use of aspirin: Secondary | ICD-10-CM | POA: Insufficient documentation

## 2012-01-27 DIAGNOSIS — H543 Unqualified visual loss, both eyes: Secondary | ICD-10-CM | POA: Insufficient documentation

## 2012-01-27 DIAGNOSIS — F411 Generalized anxiety disorder: Secondary | ICD-10-CM | POA: Insufficient documentation

## 2012-01-27 HISTORY — DX: Depression, unspecified: F32.A

## 2012-01-27 HISTORY — DX: Anxiety disorder, unspecified: F41.9

## 2012-01-27 HISTORY — DX: Unspecified visual loss: H54.7

## 2012-01-27 HISTORY — DX: Major depressive disorder, single episode, unspecified: F32.9

## 2012-01-27 HISTORY — DX: Cerebral infarction, unspecified: I63.9

## 2012-01-27 NOTE — ED Notes (Signed)
[facility] called for transport back to faciity

## 2012-01-27 NOTE — ED Notes (Signed)
ZOX:[id number]<AV> Expected date:01/27/12<BR> Expected time: 7:41 AM<BR> Means of arrival:Ambulance<BR> Comments:<BR> Fall No Injury

## 2012-01-27 NOTE — ED Notes (Signed)
Per EMS, Pt, from [facility], had an unwitnessed fall this morning.  Pt sts "I didn't see my chair and the next thing I knew, I was on the floor."  Denies hitting head or LOC.  Denies any pain.  Sts "my legs just gave out."  Vitals are stable.

## 2012-01-27 NOTE — ED Provider Notes (Signed)
History     CSN: [id number]  Arrival date & time 01/27/12  4098   First MD Initiated Contact with Patient 01/27/12 [phone]      Chief Complaint  Patient presents with  . Fall    (Consider location/radiation/quality/duration/timing/severity/associated sxs/prior treatment) Patient is a 78 y.o. female presenting with fall. The history is provided by the patient.  Fall The accident occurred less than 1 hour ago. Pertinent negatives include no numbness, no abdominal pain and no headaches.   patient with a reported fall the nursing home. She states she cannot find her chair and sit down. She's without complaints now. She states she's not weak. She states she feels fine. No pains. No weakness.  Past Medical History  Diagnosis Date  . Thyroid disease   . Hypertension   . Blind   . Anxiety   . Depression   . CVA (cerebral vascular accident)     Past Surgical History  Procedure Date  . Appendectomy   . Abdominal hysterectomy   . Eye surgery     History reviewed. No pertinent family history.  History  Substance Use Topics  . Smoking status: Never Smoker   . Smokeless tobacco: Not on file  . Alcohol Use: No    OB History    Grav Para Term Preterm Abortions TAB SAB Ect Mult Living                  Review of Systems  Constitutional: Negative for chills and fatigue.  HENT: Negative for neck stiffness.   Respiratory: Negative for chest tightness.   Cardiovascular: Negative for chest pain.  Gastrointestinal: Negative for abdominal pain.  Musculoskeletal: Negative for back pain.  Skin: Negative for wound.  Neurological: Negative for weakness, numbness and headaches.  Psychiatric/Behavioral: Negative for confusion.    Allergies  Codeine; Latex; Macrolides and ketolides; Nitrofurantoin; Oxycodone; Sulfa antibiotics; and Trimethoprim  Home Medications   Current Outpatient Rx  Name  Route  Sig  Dispense  Refill  . ACETAMINOPHEN 500 MG PO TABS   Oral   Take 500 mg by  mouth 3 (three) times daily.         . ASPIRIN EC 81 MG PO TBEC   Oral   Take 81 mg by mouth every morning.          Marland Kitchen VITAMIN D 1000 UNITS PO TABS   Oral   Take 1,000 Units by mouth daily.         Marland Kitchen CRANBERRY FRUIT 475 MG PO CAPS   Oral   Take 475 mg by mouth 2 (two) times daily.         Marland Kitchen DICLOFENAC SODIUM 1 % TD GEL   Topical   Apply 4 g topically 3 (three) times daily. Apply to left knee         . ESCITALOPRAM OXALATE 20 MG PO TABS   Oral   Take 20 mg by mouth every morning.          Marland Kitchen LATANOPROST 0.005 % OP SOLN   Both Eyes   Place 1 drop into both eyes at bedtime.         Marland Kitchen LEVOTHYROXINE SODIUM 100 MCG PO TABS   Oral   Take 100 mcg by mouth daily.         Marland Kitchen LISINOPRIL 10 MG PO TABS   Oral   Take 10 mg by mouth every morning.          Marland Kitchen LOPERAMIDE HCL 2  MG PO CAPS   Oral   Take 2 mg by mouth daily as needed. Take after first lose stool. For diarrhea         . MELATONIN 3 MG PO CAPS   Oral   Take 3 mg by mouth at bedtime.         Marland Kitchen METRONIDAZOLE 250 MG PO TABS   Oral   Take 250 mg by mouth daily as needed. Per MAR         . PILOCARPINE HCL 1 % OP SOLN   Both Eyes   Place 1 drop into both eyes 4 (four) times daily.         Marland Kitchen SIMVASTATIN 10 MG PO TABS   Oral   Take 10 mg by mouth at bedtime.         . EUCERIN EX CREA   Topical   Apply 1 application topically 2 (two) times daily.         Marland Kitchen TIMOLOL MALEATE 0.5 % OP SOLN   Both Eyes   Place 1 drop into both eyes 2 (two) times daily.         . TRAMADOL HCL 50 MG PO TABS   Oral   Take 25 mg by mouth every 6 (six) hours as needed. For pain           BP 142/87  Pulse 66  Temp 97.8 F (36.6 C) (Oral)  Resp 12  SpO2 100%  Physical Exam  Constitutional: She is oriented to person, place, and time. She appears well-developed and well-nourished.  HENT:  Head: Normocephalic.  Neck: Normal range of motion. Neck supple.  Cardiovascular: Normal rate and regular rhythm.    Pulmonary/Chest: Effort normal.  Abdominal: Soft.  Musculoskeletal: Normal range of motion. She exhibits no tenderness.  Neurological: She is alert and oriented to person, place, and time.  Skin: Skin is warm.       Apparent old bruise to right upper chest wall. No crepitance or tenderness.    ED Course  Procedures (including critical care time)  Labs Reviewed - No data to display No results found.   1. Fall       MDM  Patient with fall when she missed her chair. No complaints. She's not been weak. Will discharge back.        Juliet Rude. Rubin Payor, MD 01/27/12 [phone]

## 2012-05-25 ENCOUNTER — Encounter: Payer: Self-pay | Admitting: Gastroenterology

## 2012-06-04 ENCOUNTER — Emergency Department (HOSPITAL_COMMUNITY): Payer: Medicare Other

## 2012-06-04 ENCOUNTER — Emergency Department (HOSPITAL_COMMUNITY)
Admission: EM | Admit: 2012-06-04 | Discharge: 2012-06-04 | Disposition: A | Discharge status: Home / Self Care | Payer: Medicare Other | Attending: Emergency Medicine | Admitting: Emergency Medicine

## 2012-06-04 ENCOUNTER — Encounter (HOSPITAL_COMMUNITY): Payer: Self-pay | Admitting: Emergency Medicine

## 2012-06-04 DIAGNOSIS — Z7982 Long term (current) use of aspirin: Secondary | ICD-10-CM | POA: Insufficient documentation

## 2012-06-04 DIAGNOSIS — F411 Generalized anxiety disorder: Secondary | ICD-10-CM | POA: Insufficient documentation

## 2012-06-04 DIAGNOSIS — Y92009 Unspecified place in unspecified non-institutional (private) residence as the place of occurrence of the external cause: Secondary | ICD-10-CM | POA: Insufficient documentation

## 2012-06-04 DIAGNOSIS — N390 Urinary tract infection, site not specified: Secondary | ICD-10-CM

## 2012-06-04 DIAGNOSIS — S0101XA Laceration without foreign body of scalp, initial encounter: Secondary | ICD-10-CM

## 2012-06-04 DIAGNOSIS — Z8673 Personal history of transient ischemic attack (TIA), and cerebral infarction without residual deficits: Secondary | ICD-10-CM | POA: Insufficient documentation

## 2012-06-04 DIAGNOSIS — F329 Major depressive disorder, single episode, unspecified: Secondary | ICD-10-CM | POA: Insufficient documentation

## 2012-06-04 DIAGNOSIS — Z23 Encounter for immunization: Secondary | ICD-10-CM | POA: Insufficient documentation

## 2012-06-04 DIAGNOSIS — W1809XA Striking against other object with subsequent fall, initial encounter: Secondary | ICD-10-CM | POA: Insufficient documentation

## 2012-06-04 DIAGNOSIS — W19XXXA Unspecified fall, initial encounter: Secondary | ICD-10-CM

## 2012-06-04 DIAGNOSIS — F3289 Other specified depressive episodes: Secondary | ICD-10-CM | POA: Insufficient documentation

## 2012-06-04 DIAGNOSIS — I10 Essential (primary) hypertension: Secondary | ICD-10-CM | POA: Insufficient documentation

## 2012-06-04 DIAGNOSIS — Y9301 Activity, walking, marching and hiking: Secondary | ICD-10-CM | POA: Insufficient documentation

## 2012-06-04 DIAGNOSIS — H543 Unqualified visual loss, both eyes: Secondary | ICD-10-CM | POA: Insufficient documentation

## 2012-06-04 DIAGNOSIS — E079 Disorder of thyroid, unspecified: Secondary | ICD-10-CM | POA: Insufficient documentation

## 2012-06-04 DIAGNOSIS — Z79899 Other long term (current) drug therapy: Secondary | ICD-10-CM | POA: Insufficient documentation

## 2012-06-04 DIAGNOSIS — S0100XA Unspecified open wound of scalp, initial encounter: Secondary | ICD-10-CM | POA: Insufficient documentation

## 2012-06-04 MED ORDER — TETANUS-DIPHTH-ACELL PERTUSSIS 5-2.5-18.5 LF-MCG/0.5 IM SUSP
0.5000 mL | Freq: Once | INTRAMUSCULAR | Status: AC
Start: 2012-06-04 — End: 2012-06-04
  Administered 2012-06-04: 0.5 mL via INTRAMUSCULAR
  Filled 2012-06-04: qty 0.5

## 2012-06-04 NOTE — ED Notes (Signed)
Pt from [facility]. Pt she got up to turn up the volume on her tv and fell on way to TV.  Denies LOC.  Pt has blood matted on back of head, unable to visualize wound at this time.  Pt alert per norm according to nursing home staff.  Pt will ask questions repeatedly.

## 2012-06-04 NOTE — ED Provider Notes (Addendum)
History     CSN: [id number]  Arrival date & time 06/04/12  1800   First MD Initiated Contact with Patient 06/04/12 1802      Chief Complaint  Patient presents with  . Fall  . Head Laceration    (Consider location/radiation/quality/duration/timing/severity/associated sxs/prior treatment) Patient is a 79 y.o. female presenting with fall. The history is provided by the patient.  Fall The accident occurred 1 to 2 hours ago. The fall occurred while walking (got up to turn up the volume on the TV and fell hitting her head on the floor). She fell from a height of 1 to 2 ft. She landed on a hard floor. The volume of blood lost was minimal. The point of impact was the head. The pain is present in the head. The pain is at a severity of 0/10. The patient is experiencing no pain. She was not ambulatory at the scene. Pertinent negatives include no abdominal pain, no nausea, no headaches and no loss of consciousness. Prehospitalization: nothing. She has tried nothing for the symptoms. The treatment provided no relief.    Past Medical History  Diagnosis Date  . Thyroid disease   . Hypertension   . Blind   . Anxiety   . Depression   . CVA (cerebral vascular accident)     Past Surgical History  Procedure Laterality Date  . Appendectomy    . Abdominal hysterectomy    . Eye surgery      History reviewed. No pertinent family history.  History  Substance Use Topics  . Smoking status: Never Smoker   . Smokeless tobacco: Not on file  . Alcohol Use: No    OB History   Grav Para Term Preterm Abortions TAB SAB Ect Mult Living                  Review of Systems  Gastrointestinal: Negative for nausea and abdominal pain.  Neurological: Negative for loss of consciousness and headaches.  All other systems reviewed and are negative.    Allergies  Codeine; Latex; Macrolides and ketolides; Nitrofurantoin; Oxycodone; Sulfa antibiotics; and Trimethoprim  Home Medications   Current  Outpatient Rx  Name  Route  Sig  Dispense  Refill  . acetaminophen (TYLENOL) 500 MG tablet   Oral   Take 500 mg by mouth every 6 (six) hours as needed for pain.          Marland Kitchen aspirin EC 81 MG tablet   Oral   Take 81 mg by mouth every morning.          . cholecalciferol (VITAMIN D) 1000 UNITS tablet   Oral   Take 1,000 Units by mouth every morning.          . ciprofloxacin (CIPRO) 250 MG tablet   Oral   Take 250 mg by mouth 2 (two) times daily.         . Cranberry Fruit 475 MG CAPS   Oral   Take 475 mg by mouth 2 (two) times daily.         . diclofenac sodium (VOLTAREN) 1 % GEL   Topical   Apply 4 g topically 3 (three) times daily. Apply to left knee         . escitalopram (LEXAPRO) 5 MG tablet   Oral   Take 5 mg by mouth every morning.         . latanoprost (XALATAN) 0.005 % ophthalmic solution   Both Eyes   Place 1 drop  into both eyes at bedtime.         Marland Kitchen levothyroxine (SYNTHROID, LEVOTHROID) 75 MCG tablet   Oral   Take 75 mcg by mouth daily before breakfast.         . lisinopril (PRINIVIL,ZESTRIL) 5 MG tablet   Oral   Take 5 mg by mouth every morning.         . loperamide (IMODIUM) 2 MG capsule   Oral   Take 2 mg by mouth daily as needed. Take after first lose stool. For diarrhea         . Melatonin 3 MG CAPS   Oral   Take 3 mg by mouth at bedtime.         . miconazole (BAZA ANTIFUNGAL) 2 % cream   Topical   Apply 1 application topically 3 (three) times daily. Applies to buttocks         . pilocarpine (PILOCAR) 1 % ophthalmic solution   Both Eyes   Place 1 drop into both eyes 4 (four) times daily.         . timolol (TIMOPTIC) 0.5 % ophthalmic solution   Both Eyes   Place 1 drop into both eyes 2 (two) times daily.         . vitamin B-12 (CYANOCOBALAMIN) 1000 MCG tablet   Oral   Take 1,000 mcg by mouth every morning.           There were no vitals taken for this visit.  Physical Exam  Nursing note and vitals  reviewed. Constitutional: She is oriented to person, place, and time. She appears well-developed and well-nourished. No distress.  HENT:  Head: Normocephalic and atraumatic.  Mouth/Throat: Oropharynx is clear and moist.  Eyes: Conjunctivae and EOM are normal. Pupils are equal, round, and reactive to light.  Neck: Normal range of motion. Neck supple. No spinous process tenderness and no muscular tenderness present.  Cardiovascular: Normal rate, regular rhythm and intact distal pulses.   No murmur heard. Pulmonary/Chest: Effort normal and breath sounds normal. No respiratory distress. She has no decreased breath sounds. She has no wheezes. She has no rales.    Abdominal: Soft. She exhibits no distension. There is no tenderness. There is no rebound and no guarding.    Musculoskeletal: Normal range of motion. She exhibits no edema and no tenderness.       Thoracic back: Normal.       Lumbar back: Normal.  Mild ecchymosis of the left knee but full range of motion and nontender  Neurological: She is alert and oriented to person, place, and time.  Skin: Skin is warm and dry. No rash noted. No erythema.  Psychiatric: She has a normal mood and affect. Her behavior is normal.    ED Course  Procedures (including critical care time)  Labs Reviewed - No data to display Dg Chest 2 View  06/04/2012  *RADIOLOGY REPORT*  Clinical Data: Fall and pain.  CHEST - 2 VIEW  Comparison: 12/28/2010  Findings: Two views of the chest were obtained.  There is no evidence for a pneumothorax.  There are low lung volumes.  Stable appearance of the heart and mediastinum.  Bony thorax appears to be intact.  IMPRESSION: Low lung volumes without focal disease.   Original Report Authenticated By: Richarda Overlie, M.D.    Ct Head Wo Contrast  06/04/2012  *RADIOLOGY REPORT*  Clinical Data:  Fall, posterior scalp laceration  CT HEAD WITHOUT CONTRAST CT CERVICAL SPINE WITHOUT CONTRAST  Technique:  Multidetector CT imaging of the  head and cervical spine was performed following the standard protocol without intravenous contrast.  Multiplanar CT image reconstructions of the cervical spine were also generated.  Comparison:  Most recent prior CT scan of the head 10/01/2011; most recent prior CT scan of the cervical spine 11/08/2010  CT HEAD  Findings: No acute intracranial hemorrhage, acute infarction, mass lesion, mass effect, midline shift or hydrocephalus.  Gray-white differentiation is preserved throughout.  Stable cerebral and cerebellar atrophy with compensatory ex vacuo dilatation of the lateral ventricles.  Moderate periventricular, subcortical and deep white matter hypoattenuation which is nonspecific but most consistent with the sequela of longstanding microvascular ischemia. Mild focal soft tissue swelling overlying the left posterior occiput consistent with small hematoma.  No underlying calvarial fracture.  The globes are intact.  The orbits are unremarkable. Normal aeration of the mastoid air cells and paranasal sinuses. Atherosclerotic calcification noted in the bilateral internal carotid arteries.  Cerumen impaction in the bilateral external auditory canals.  IMPRESSION:  1.  No acute intracranial abnormality 2.  Stable atrophy and sequela of longstanding microvascular ischemic disease 3.  Cerumen impaction in the bilateral external auditory canals  CT CERVICAL SPINE  Findings: No acute fracture, malalignment or prevertebral soft tissue swelling.  No focal soft tissue abnormality.  Surgical clips noted in the right os neck and sternocleidomastoid muscle.  Mild biapical pleural parenchymal scarring.  No suspicious pulmonary nodule in the visualized portions of the lungs.  The dens is intact.  Focal cervical spondylosis at C5-C6 where posterior disc osteophyte complex results in mild central canal narrowing.  No significant interval progression compared to prior.  IMPRESSION: No acute fracture or malalignment.   Original Report  Authenticated By: Malachy Moan, M.D.    Ct Cervical Spine Wo Contrast  06/04/2012  *RADIOLOGY REPORT*  Clinical Data:  Fall, posterior scalp laceration  CT HEAD WITHOUT CONTRAST CT CERVICAL SPINE WITHOUT CONTRAST  Technique:  Multidetector CT imaging of the head and cervical spine was performed following the standard protocol without intravenous contrast.  Multiplanar CT image reconstructions of the cervical spine were also generated.  Comparison:  Most recent prior CT scan of the head 10/01/2011; most recent prior CT scan of the cervical spine 11/08/2010  CT HEAD  Findings: No acute intracranial hemorrhage, acute infarction, mass lesion, mass effect, midline shift or hydrocephalus.  Gray-white differentiation is preserved throughout.  Stable cerebral and cerebellar atrophy with compensatory ex vacuo dilatation of the lateral ventricles.  Moderate periventricular, subcortical and deep white matter hypoattenuation which is nonspecific but most consistent with the sequela of longstanding microvascular ischemia. Mild focal soft tissue swelling overlying the left posterior occiput consistent with small hematoma.  No underlying calvarial fracture.  The globes are intact.  The orbits are unremarkable. Normal aeration of the mastoid air cells and paranasal sinuses. Atherosclerotic calcification noted in the bilateral internal carotid arteries.  Cerumen impaction in the bilateral external auditory canals.  IMPRESSION:  1.  No acute intracranial abnormality 2.  Stable atrophy and sequela of longstanding microvascular ischemic disease 3.  Cerumen impaction in the bilateral external auditory canals  CT CERVICAL SPINE  Findings: No acute fracture, malalignment or prevertebral soft tissue swelling.  No focal soft tissue abnormality.  Surgical clips noted in the right os neck and sternocleidomastoid muscle.  Mild biapical pleural parenchymal scarring.  No suspicious pulmonary nodule in the visualized portions of the lungs.   The dens is intact.  Focal cervical spondylosis at C5-C6 where posterior  disc osteophyte complex results in mild central canal narrowing.  No significant interval progression compared to prior.  IMPRESSION: No acute fracture or malalignment.   Original Report Authenticated By: Malachy Moan, M.D.     LACERATION REPAIR Performed by: Gwyneth Sprout Authorized by: Gwyneth Sprout Consent: Verbal consent obtained. Risks and benefits: risks, benefits and alternatives were discussed Consent given by: patient Patient identity confirmed: provided demographic data Prepped and Draped in normal sterile fashion Wound explored  Laceration Location: scalp  Laceration Length: 2cm  No Foreign Bodies seen or palpated  Anesthesia: none Irrigation method: syringe Amount of cleaning: standard  Skin closure: staples  Number of sutures: 2  Technique: staples  Patient tolerance: Patient tolerated the procedure well with no immediate complications.  1. UTI (lower urinary tract infection)   2. Fall at home, initial encounter   3. Scalp laceration, initial encounter       MDM   Patient with a history of a mechanical fall in her room and she hit her head. Patient she got up to turn up the volume on her TV and in the is not sure whether she hit her head. She denies any LOC and nursing home stay she was acting her normal self.  On exam patient does have a laceration that is 2 cm to the back of her head. Otherwise on exam she is awake and alert. She denies neck pain but does have some bruising on the back of her ribs. She states they're nontender but winces when I palpate them.  7:37 PM Cancer negative for acute findings. Was repaired as above. Family is now present and states she's had multiple today and nursing home checked her urine and when the daughter called they reported it was positive and she started Cipro today. Family feels that she is safe at home and has people to care for her. Do  not feel that she needs admission to the hospital at this time. Since patient is to started on Cipro did not feel that she needs change in therapy at this time. She is awake and alert and will discharge back home     Gwyneth Sprout, MD 06/04/12 1610  Gwyneth Sprout, MD 06/04/12 1940

## 2012-06-04 NOTE — ED Notes (Signed)
[facility] called for transport.  

## 2012-06-04 NOTE — ED Notes (Signed)
ZOX:[id number]<AV> Expected date:06/04/12<BR> Expected time: 5:50 PM<BR> Means of arrival:Ambulance<BR> Comments:<BR> Fall, lac

## 2012-06-08 ENCOUNTER — Emergency Department (HOSPITAL_COMMUNITY): Payer: Medicare Other

## 2012-06-08 ENCOUNTER — Inpatient Hospital Stay (HOSPITAL_COMMUNITY)
Admission: EM | Admit: 2012-06-08 | Discharge: 2012-06-14 | DRG: 871 | Disposition: A | Discharge status: Hospice | Payer: Medicare Other | Attending: Internal Medicine | Admitting: Internal Medicine

## 2012-06-08 ENCOUNTER — Encounter (HOSPITAL_COMMUNITY): Payer: Self-pay | Admitting: Emergency Medicine

## 2012-06-08 DIAGNOSIS — R6520 Severe sepsis without septic shock: Secondary | ICD-10-CM | POA: Diagnosis present

## 2012-06-08 DIAGNOSIS — H543 Unqualified visual loss, both eyes: Secondary | ICD-10-CM | POA: Diagnosis present

## 2012-06-08 DIAGNOSIS — N179 Acute kidney failure, unspecified: Secondary | ICD-10-CM | POA: Diagnosis present

## 2012-06-08 DIAGNOSIS — R531 Weakness: Secondary | ICD-10-CM

## 2012-06-08 DIAGNOSIS — E079 Disorder of thyroid, unspecified: Secondary | ICD-10-CM | POA: Diagnosis present

## 2012-06-08 DIAGNOSIS — H5410 Blindness, one eye, low vision other eye, unspecified eyes: Secondary | ICD-10-CM | POA: Diagnosis present

## 2012-06-08 DIAGNOSIS — Z9181 History of falling: Secondary | ICD-10-CM

## 2012-06-08 DIAGNOSIS — G929 Unspecified toxic encephalopathy: Secondary | ICD-10-CM | POA: Diagnosis present

## 2012-06-08 DIAGNOSIS — R32 Unspecified urinary incontinence: Secondary | ICD-10-CM | POA: Diagnosis present

## 2012-06-08 DIAGNOSIS — R109 Unspecified abdominal pain: Secondary | ICD-10-CM

## 2012-06-08 DIAGNOSIS — F329 Major depressive disorder, single episode, unspecified: Secondary | ICD-10-CM | POA: Diagnosis present

## 2012-06-08 DIAGNOSIS — W19XXXA Unspecified fall, initial encounter: Secondary | ICD-10-CM

## 2012-06-08 DIAGNOSIS — I6990 Unspecified sequelae of unspecified cerebrovascular disease: Secondary | ICD-10-CM

## 2012-06-08 DIAGNOSIS — Z66 Do not resuscitate: Secondary | ICD-10-CM | POA: Diagnosis present

## 2012-06-08 DIAGNOSIS — N1330 Unspecified hydronephrosis: Secondary | ICD-10-CM | POA: Diagnosis present

## 2012-06-08 DIAGNOSIS — Z7982 Long term (current) use of aspirin: Secondary | ICD-10-CM

## 2012-06-08 DIAGNOSIS — F0390 Unspecified dementia without behavioral disturbance: Secondary | ICD-10-CM | POA: Diagnosis present

## 2012-06-08 DIAGNOSIS — R4182 Altered mental status, unspecified: Secondary | ICD-10-CM | POA: Diagnosis present

## 2012-06-08 DIAGNOSIS — F411 Generalized anxiety disorder: Secondary | ICD-10-CM | POA: Diagnosis present

## 2012-06-08 DIAGNOSIS — R159 Full incontinence of feces: Secondary | ICD-10-CM | POA: Diagnosis present

## 2012-06-08 DIAGNOSIS — B952 Enterococcus as the cause of diseases classified elsewhere: Secondary | ICD-10-CM | POA: Diagnosis present

## 2012-06-08 DIAGNOSIS — I4891 Unspecified atrial fibrillation: Secondary | ICD-10-CM | POA: Diagnosis present

## 2012-06-08 DIAGNOSIS — E039 Hypothyroidism, unspecified: Secondary | ICD-10-CM | POA: Diagnosis present

## 2012-06-08 DIAGNOSIS — E871 Hypo-osmolality and hyponatremia: Secondary | ICD-10-CM | POA: Diagnosis present

## 2012-06-08 DIAGNOSIS — I10 Essential (primary) hypertension: Secondary | ICD-10-CM | POA: Diagnosis present

## 2012-06-08 DIAGNOSIS — Z79899 Other long term (current) drug therapy: Secondary | ICD-10-CM

## 2012-06-08 DIAGNOSIS — K5900 Constipation, unspecified: Secondary | ICD-10-CM | POA: Diagnosis present

## 2012-06-08 DIAGNOSIS — K5641 Fecal impaction: Secondary | ICD-10-CM | POA: Diagnosis present

## 2012-06-08 DIAGNOSIS — R1310 Dysphagia, unspecified: Secondary | ICD-10-CM | POA: Diagnosis not present

## 2012-06-08 DIAGNOSIS — R627 Adult failure to thrive: Secondary | ICD-10-CM | POA: Diagnosis present

## 2012-06-08 DIAGNOSIS — N139 Obstructive and reflux uropathy, unspecified: Secondary | ICD-10-CM | POA: Diagnosis present

## 2012-06-08 DIAGNOSIS — G92 Toxic encephalopathy: Secondary | ICD-10-CM | POA: Diagnosis present

## 2012-06-08 DIAGNOSIS — A419 Sepsis, unspecified organism: Principal | ICD-10-CM | POA: Diagnosis present

## 2012-06-08 DIAGNOSIS — F3289 Other specified depressive episodes: Secondary | ICD-10-CM | POA: Diagnosis present

## 2012-06-08 DIAGNOSIS — Y92129 Unspecified place in nursing home as the place of occurrence of the external cause: Secondary | ICD-10-CM

## 2012-06-08 DIAGNOSIS — N390 Urinary tract infection, site not specified: Secondary | ICD-10-CM | POA: Diagnosis present

## 2012-06-08 DIAGNOSIS — Z8673 Personal history of transient ischemic attack (TIA), and cerebral infarction without residual deficits: Secondary | ICD-10-CM

## 2012-06-08 DIAGNOSIS — E43 Unspecified severe protein-calorie malnutrition: Secondary | ICD-10-CM | POA: Diagnosis present

## 2012-06-08 LAB — COMPREHENSIVE METABOLIC PANEL
AST: 84 U/L — ABNORMAL HIGH (ref 0–37)
Albumin: 2.6 g/dL — ABNORMAL LOW (ref 3.5–5.2)
BUN: 86 mg/dL — ABNORMAL HIGH (ref 6–23)
Creatinine, Ser: 2.14 mg/dL — ABNORMAL HIGH (ref 0.50–1.10)
Potassium: 4.5 mEq/L (ref 3.5–5.1)
Total Protein: 6.5 g/dL (ref 6.0–8.3)

## 2012-06-08 LAB — URINALYSIS, ROUTINE W REFLEX MICROSCOPIC
Nitrite: NEGATIVE
Specific Gravity, Urine: 1.019 (ref 1.005–1.030)
pH: 5 (ref 5.0–8.0)

## 2012-06-08 LAB — CG4 I-STAT (LACTIC ACID): Lactic Acid, Venous: 0.73 mmol/L (ref 0.5–2.2)

## 2012-06-08 LAB — CBC WITH DIFFERENTIAL/PLATELET
Basophils Absolute: 0 10*3/uL (ref 0.0–0.1)
Basophils Relative: 0 % (ref 0–1)
Eosinophils Absolute: 0 10*3/uL (ref 0.0–0.7)
Hemoglobin: 10.7 g/dL — ABNORMAL LOW (ref 12.0–15.0)
MCH: 31.3 pg (ref 26.0–34.0)
MCHC: 36.8 g/dL — ABNORMAL HIGH (ref 30.0–36.0)
Monocytes Absolute: 1.1 10*3/uL — ABNORMAL HIGH (ref 0.1–1.0)
Monocytes Relative: 7 % (ref 3–12)
Neutrophils Relative %: 90 % — ABNORMAL HIGH (ref 43–77)
RDW: 13.3 % (ref 11.5–15.5)

## 2012-06-08 LAB — LIPASE, BLOOD: Lipase: 13 U/L (ref 11–59)

## 2012-06-08 LAB — URINE MICROSCOPIC-ADD ON

## 2012-06-08 LAB — CREATININE, URINE, RANDOM: Creatinine, Urine: 65.19 mg/dL

## 2012-06-08 LAB — MRSA PCR SCREENING: MRSA by PCR: NEGATIVE

## 2012-06-08 LAB — TROPONIN I: Troponin I: <0.3 ng/mL (ref <0.30)

## 2012-06-08 MED ORDER — ENOXAPARIN SODIUM 30 MG/0.3ML ~~LOC~~ SOLN
30.0000 mg | SUBCUTANEOUS | Status: DC
  Administered 2012-06-08 – 2012-06-13 (×6): 30 mg via SUBCUTANEOUS
  Filled 2012-06-08 (×7): qty 0.3

## 2012-06-08 MED ORDER — PILOCARPINE HCL 1 % OP SOLN
1.0000 [drp] | Freq: Four times a day (QID) | OPHTHALMIC | Status: DC
  Administered 2012-06-08 – 2012-06-14 (×22): 1 [drp] via OPHTHALMIC
  Filled 2012-06-08: qty 15

## 2012-06-08 MED ORDER — SODIUM CHLORIDE 0.9 % IJ SOLN
3.0000 mL | Freq: Two times a day (BID) | INTRAMUSCULAR | Status: DC
  Administered 2012-06-11 – 2012-06-13 (×4): 3 mL via INTRAVENOUS

## 2012-06-08 MED ORDER — FENTANYL CITRATE 0.05 MG/ML IJ SOLN
50.0000 ug | Freq: Once | INTRAMUSCULAR | Status: AC
  Administered 2012-06-08: 50 ug via INTRAVENOUS
  Filled 2012-06-08: qty 2

## 2012-06-08 MED ORDER — SENNOSIDES-DOCUSATE SODIUM 8.6-50 MG PO TABS
2.0000 | ORAL_TABLET | Freq: Two times a day (BID) | ORAL | Status: DC
  Administered 2012-06-08 – 2012-06-14 (×11): 2 via ORAL
  Filled 2012-06-08 (×13): qty 2

## 2012-06-08 MED ORDER — DEXTROSE 5 % IV SOLN
1.0000 g | INTRAVENOUS | Status: DC
  Administered 2012-06-09: 1 g via INTRAVENOUS
  Filled 2012-06-08: qty 10

## 2012-06-08 MED ORDER — ACETAMINOPHEN 325 MG PO TABS
650.0000 mg | ORAL_TABLET | Freq: Four times a day (QID) | ORAL | Status: DC | PRN
  Administered 2012-06-11: 650 mg via ORAL
  Filled 2012-06-08: qty 2

## 2012-06-08 MED ORDER — VITAMIN D3 25 MCG (1000 UNIT) PO TABS
1000.0000 [IU] | ORAL_TABLET | Freq: Every morning | ORAL | Status: DC
  Administered 2012-06-09 – 2012-06-14 (×6): 1000 [IU] via ORAL
  Filled 2012-06-08 (×6): qty 1

## 2012-06-08 MED ORDER — DEXTROSE 5 % IV SOLN
1.0000 g | Freq: Once | INTRAVENOUS | Status: AC
  Administered 2012-06-08: 1 g via INTRAVENOUS
  Filled 2012-06-08: qty 10

## 2012-06-08 MED ORDER — SODIUM CHLORIDE 0.9 % IV BOLUS (SEPSIS)
500.0000 mL | Freq: Once | INTRAVENOUS | Status: AC
  Administered 2012-06-08: 500 mL via INTRAVENOUS

## 2012-06-08 MED ORDER — POLYETHYLENE GLYCOL 3350 17 G PO PACK
17.0000 g | PACK | Freq: Every day | ORAL | Status: DC
  Administered 2012-06-09 – 2012-06-12 (×3): 17 g via ORAL
  Filled 2012-06-08 (×4): qty 1

## 2012-06-08 MED ORDER — ESCITALOPRAM OXALATE 5 MG PO TABS
5.0000 mg | ORAL_TABLET | Freq: Every morning | ORAL | Status: DC
  Administered 2012-06-09 – 2012-06-14 (×6): 5 mg via ORAL
  Filled 2012-06-08 (×6): qty 1

## 2012-06-08 MED ORDER — BISACODYL 10 MG RE SUPP
10.0000 mg | Freq: Once | RECTAL | Status: AC
  Administered 2012-06-08: 10 mg via RECTAL
  Filled 2012-06-08: qty 1

## 2012-06-08 MED ORDER — IOHEXOL 300 MG/ML  SOLN: 50.0000 mL | INTRAMUSCULAR | Status: AC

## 2012-06-08 MED ORDER — TIMOLOL MALEATE 0.5 % OP SOLN
1.0000 [drp] | Freq: Two times a day (BID) | OPHTHALMIC | Status: DC
  Administered 2012-06-08 – 2012-06-14 (×12): 1 [drp] via OPHTHALMIC
  Filled 2012-06-08: qty 5

## 2012-06-08 MED ORDER — SODIUM CHLORIDE 0.9 % IV SOLN
1000.0000 mL | Freq: Once | INTRAVENOUS | Status: AC
  Administered 2012-06-08: 1000 mL via INTRAVENOUS

## 2012-06-08 MED ORDER — LEVOTHYROXINE SODIUM 75 MCG PO TABS
75.0000 ug | ORAL_TABLET | Freq: Every day | ORAL | Status: DC
  Administered 2012-06-09 – 2012-06-13 (×4): 75 ug via ORAL
  Filled 2012-06-08 (×8): qty 1

## 2012-06-08 MED ORDER — SODIUM CHLORIDE 0.9 % IV SOLN
INTRAVENOUS | Status: DC
  Administered 2012-06-08: 22:00:00 via INTRAVENOUS

## 2012-06-08 MED ORDER — VITAMIN B-12 1000 MCG PO TABS
1000.0000 ug | ORAL_TABLET | Freq: Every day | ORAL | Status: DC
  Administered 2012-06-09 – 2012-06-14 (×6): 1000 ug via ORAL
  Filled 2012-06-08 (×6): qty 1

## 2012-06-08 MED ORDER — ONDANSETRON HCL 4 MG/2ML IJ SOLN
4.0000 mg | Freq: Once | INTRAMUSCULAR | Status: AC
  Administered 2012-06-08: 4 mg via INTRAVENOUS
  Filled 2012-06-08: qty 2

## 2012-06-08 MED ORDER — ASPIRIN EC 81 MG PO TBEC
81.0000 mg | DELAYED_RELEASE_TABLET | Freq: Every morning | ORAL | Status: DC
  Administered 2012-06-09 – 2012-06-14 (×6): 81 mg via ORAL
  Filled 2012-06-08 (×6): qty 1

## 2012-06-08 MED ORDER — CRANBERRY FRUIT 475 MG PO CAPS: 475.0000 mg | ORAL_CAPSULE | Freq: Two times a day (BID) | ORAL | Status: DC

## 2012-06-08 MED ORDER — SODIUM CHLORIDE 0.9 % IV BOLUS (SEPSIS)
1000.0000 mL | Freq: Once | INTRAVENOUS | Status: AC
  Administered 2012-06-08: 1000 mL via INTRAVENOUS

## 2012-06-08 MED ORDER — SODIUM CHLORIDE 0.9 % IV SOLN: 1000.0000 mL | INTRAVENOUS | Status: DC

## 2012-06-08 MED ORDER — ACETAMINOPHEN 650 MG RE SUPP: 650.0000 mg | Freq: Four times a day (QID) | RECTAL | Status: DC | PRN

## 2012-06-08 NOTE — Progress Notes (Signed)
PHARMACIST - PHYSICIAN ORDER COMMUNICATION  CONCERNING: P&T Medication Policy on Herbal Medications  DESCRIPTION:  This patient's order for:  Cranberry  has been noted.  This product(s) is classified as an "herbal" or natural product. Due to a lack of definitive safety studies or FDA approval, nonstandard manufacturing practices, plus the potential risk of unknown drug-drug interactions while on inpatient medications, the Pharmacy and Therapeutics Committee does not permit the use of "herbal" or natural products of this type within [place].   ACTION TAKEN: The pharmacy department is unable to verify this order at this time and your patient has been informed of this safety policy. Please reevaluate patient's clinical condition at discharge and address if the herbal or natural product(s) should be resumed at that time.   

## 2012-06-08 NOTE — ED Provider Notes (Signed)
History     CSN: [id number]  Arrival date & time 06/08/12  1232   First MD Initiated Contact with Patient 06/08/12 1238      Chief Complaint  Patient presents with  . Abdominal Pain    (Consider location/radiation/quality/duration/timing/severity/associated sxs/prior treatment) HPI Comments: Patient brought in today by EMS from [facility] due to abdominal pain.  Unable to obtain history from patient secondary to dementia.  Patient unable to state how long the abdominal pain has been present.  According to EMS last BM was several days ago.  No known vomiting.  No known fevers.  Patient had a recent fall on 06/04/12 and was seen in the ED at that time.  Ct head w/o contrast was negative at that time.  Daughter reports that the patient's PCP is at [facility].  Daughter also reports that the patient is not on any blood thinning medications at this time.  The history is provided by the EMS personnel.    Past Medical History  Diagnosis Date  . Thyroid disease   . Hypertension   . Blind   . Anxiety   . Depression   . CVA (cerebral vascular accident)     Past Surgical History  Procedure Laterality Date  . Appendectomy    . Abdominal hysterectomy    . Eye surgery      History reviewed. No pertinent family history.  History  Substance Use Topics  . Smoking status: Never Smoker   . Smokeless tobacco: Not on file  . Alcohol Use: No    OB History   Grav Para Term Preterm Abortions TAB SAB Ect Mult Living                  Review of Systems  Unable to perform ROS: Dementia    Allergies  Codeine; Latex; Macrolides and ketolides; Nitrofurantoin; Oxycodone; Sulfa antibiotics; and Trimethoprim  Home Medications   Current Outpatient Rx  Name  Route  Sig  Dispense  Refill  . acetaminophen (TYLENOL) 500 MG tablet   Oral   Take 500 mg by mouth every 6 (six) hours as needed for pain.          Marland Kitchen aspirin EC 81 MG tablet   Oral   Take 81 mg by mouth  every morning.          . cholecalciferol (VITAMIN D) 1000 UNITS tablet   Oral   Take 1,000 Units by mouth every morning.          . ciprofloxacin (CIPRO) 250 MG tablet   Oral   Take 250 mg by mouth 2 (two) times daily.         . Cranberry Fruit 475 MG CAPS   Oral   Take 475 mg by mouth 2 (two) times daily.         . diclofenac sodium (VOLTAREN) 1 % GEL   Topical   Apply 4 g topically 3 (three) times daily. Apply to left knee         . escitalopram (LEXAPRO) 5 MG tablet   Oral   Take 5 mg by mouth every morning.         . latanoprost (XALATAN) 0.005 % ophthalmic solution   Both Eyes   Place 1 drop into both eyes at bedtime.         Marland Kitchen levothyroxine (SYNTHROID, LEVOTHROID) 75 MCG tablet   Oral   Take 75 mcg by mouth daily before breakfast.         .  lisinopril (PRINIVIL,ZESTRIL) 5 MG tablet   Oral   Take 5 mg by mouth every morning.         . loperamide (IMODIUM) 2 MG capsule   Oral   Take 2 mg by mouth daily as needed. Take after first lose stool. For diarrhea         . Melatonin 3 MG CAPS   Oral   Take 3 mg by mouth at bedtime.         . miconazole (BAZA ANTIFUNGAL) 2 % cream   Topical   Apply 1 application topically 3 (three) times daily. Applies to buttocks         . pilocarpine (PILOCAR) 1 % ophthalmic solution   Both Eyes   Place 1 drop into both eyes 4 (four) times daily.         . timolol (TIMOPTIC) 0.5 % ophthalmic solution   Both Eyes   Place 1 drop into both eyes 2 (two) times daily.         . vitamin B-12 (CYANOCOBALAMIN) 1000 MCG tablet   Oral   Take 1,000 mcg by mouth every morning.           BP 100/65  Pulse 87  Temp(Src) 97.5 F (36.4 C)  Resp 18  SpO2 95%  Physical Exam  Nursing note and vitals reviewed. Constitutional: She appears well-developed and well-nourished.  HENT:  Head: Normocephalic and atraumatic.  Mouth/Throat: Mucous membranes are dry.  Neck: Normal range of motion. Neck supple.   Cardiovascular: Normal rate, regular rhythm and normal heart sounds.   Pulmonary/Chest: Effort normal and breath sounds normal.  Abdominal: Soft. Bowel sounds are decreased. There is tenderness. There is no rebound and no guarding.  Right sided abdominal hernia.  Unable to reduce Diffuse abdominal tenderness to palpation that appears to be worse on the right side  Neurological: She is alert.  Skin: Skin is warm and dry.  Psychiatric: She has a normal mood and affect.    ED Course  Procedures (including critical care time)  Labs Reviewed  CBC WITH DIFFERENTIAL  COMPREHENSIVE METABOLIC PANEL  LIPASE, BLOOD   No results found.   No diagnosis found.  Blood pressure improved after IVF.  Daughter in law presented to the ED after initial evaluation.  She states that the patient has been more confused since her fall on 06/04/12.  She also reports that the patient's last bowel movement was yesterday.  Daughter reports that the patient has not been vomiting.  Will order CT head to rule out intracranial bleed.  Daughter also reports that the patient is DNR.  Discussed with Dr. Gonzella Lex with [facility] who has agreed to admit the patient.  MDM  Patient presenting with abdominal pain.  CT ab/pelvis ordered.  Labs show hyponatremia and increased Creatine.  Creatine 2.14 today, which appears to be elevated from baseline of 0.88. UA showing UTI.  Patient started on Ceftriaxone.  Patient also found to be dehydrated and hyponatremic.  IVF ordered.  Patient found to have leukocytosis with WBC of 15.3.  Patient's lactate is WNL.  Daughter also reported that the patient has been more confused since she fell on 06/04/12.  CT head at that time was negative.  CT head ordered again today to assess for delayed bleed.  Results of the CT ab/pelvis and the CT head pending at this time.          Pascal Lux [place], PA-C 06/08/12 1703  Pascal Lux [place], PA-C 06/08/12  1754 

## 2012-06-08 NOTE — H&P (Addendum)
[facility] History and Physical  JESUSITA JOCELYN ZOX:[id number] DOB: [birth year] DOA: 06/08/2012  Referring physician: ED PCP: Florentina Jenny, MD   Chief Complaint:  altered mental status for 2-3 days  History provided by patient's daughter at bedside as patient is significantly demented  HPI:  79 year old female with dementia, hypertension, hypothyroidism, minimal bilateral vision and history of stroke living in a NH was brought to the hospital by her daughter for further mental status for possible to 3 days. Patient was seen in the ED fluid is a walker she had a fall and lacerated her scalp which was stapled. The daughter reports that patient has been having frequent falls for the last several weeks and has been nonambulatory. During her recent ED visit she was noted to have UTI and was given a course of ciprofloxacin. At baseline patient has very little vision and is unable to identify people. She is able to carry out a few conversation but is quite confused. Her daughter noticed that for possible to 3 days she was increasingly confused and trying to eat napkins and bed sheets and throwing things up in the air. Patient did not have any fever or chills, nausea or vomiting or complaint of any chest discomfort or shortness of breath. Was not noted to have diarrhea. However daughter noticed some swelling in her right side of the abdomen and on palpation patient was in discomfort. Daughter reports that patient has good bowel movements daily.  Course in the ED Patient was noted to have borderline blood pressure on presentation. Blood will be unfolded leukocytosis and anemia. Chemistry showed hyponatremia with sodium of 123, chloride of 91, low bicarbonate and anion gap of 14. Patient was noted to have acute kidney injury with creatinine of 2.1. We do not have any recent labs since  9 months back when her renal function were stable. UA done was concerning for UTI. Patient was given a dose of IV  Rocephin in the ED.  head CT was done which was unremarkable. Abdominal CT done showing large stool volume with rectal distention. There was also rectal wall thickening with good effect and edema in the pelvic floor. Marked bladder distention and right greater than left hydroureteronephrosis was seen. Also reports on possible bladder outlet obstruction due to large stool in in the rectum.  [facility] called for  admission given AKI, leucocytosis with UTI and hyponatremia.    Review of Systems:  As outlined in history of present illness given patient's dementia   Past Medical History  Diagnosis Date  . Thyroid disease   . Hypertension   . Blind   . Anxiety   . Depression   . CVA (cerebral vascular accident)    Past Surgical History  Procedure Laterality Date  . Appendectomy    . Abdominal hysterectomy    . Eye surgery     Social History:  reports that she has never smoked. She does not have any smokeless tobacco history on file. She reports that she does not drink alcohol or use illicit drugs.  Allergies  Allergen Reactions  . Codeine Other (See Comments)    Per MAR  . Latex Other (See Comments)    Per MAR  . Macrolides And Ketolides Other (See Comments)    Per MAR  . Nitrofurantoin Other (See Comments)    Per MAR  . Oxycodone Other (See Comments)    Per MAR  . Sulfa Antibiotics Other (See Comments)    Per MAR  . Trimethoprim Other (  See Comments)    Per MAR    History reviewed. No pertinent family history.  Prior to Admission medications   Medication Sig Start Date End Date Taking? Authorizing Provider  acetaminophen (TYLENOL) 500 MG tablet Take 500 mg by mouth every 6 (six) hours as needed for pain.    Yes Historical Provider, MD  aspirin EC 81 MG tablet Take 81 mg by mouth every morning.    Yes Historical Provider, MD  cholecalciferol (VITAMIN D) 1000 UNITS tablet Take 1,000 Units by mouth every morning.    Yes Historical Provider, MD  ciprofloxacin (CIPRO)  250 MG tablet Take 250 mg by mouth 2 (two) times daily. 06/03/12 06/13/12 Yes Historical Provider, MD  Cranberry Fruit 475 MG CAPS Take 475 mg by mouth 2 (two) times daily.   Yes Historical Provider, MD  diclofenac sodium (VOLTAREN) 1 % GEL Apply 4 g topically 3 (three) times daily. Left knee   Yes Historical Provider, MD  escitalopram (LEXAPRO) 5 MG tablet Take 5 mg by mouth every morning.   Yes Historical Provider, MD  Lactase (LACTAID FAST ACT) 9000 UNITS CHEW Chew 1 tablet by mouth 3 (three) times daily before meals.   Yes Historical Provider, MD  levothyroxine (SYNTHROID, LEVOTHROID) 75 MCG tablet Take 75 mcg by mouth daily before breakfast.   Yes Historical Provider, MD  lisinopril (PRINIVIL,ZESTRIL) 5 MG tablet Take 5 mg by mouth every morning.   Yes Historical Provider, MD  loperamide (IMODIUM) 2 MG capsule Take 2-4 mg by mouth daily as needed. Take after first lose stool. For diarrhea   Yes Historical Provider, MD  Melatonin 3 MG CAPS Take 3 mg by mouth at bedtime.   Yes Historical Provider, MD  miconazole (BAZA ANTIFUNGAL) 2 % cream Apply 1 application topically 3 (three) times daily. Applies to buttocks   Yes Historical Provider, MD  pilocarpine (PILOCAR) 1 % ophthalmic solution Place 1 drop into both eyes 4 (four) times daily.   Yes Historical Provider, MD  Skin Protectants, Misc. (EUCERIN) cream Apply 1 application topically 2 (two) times daily. To lower extremities   Yes Historical Provider, MD  timolol (TIMOPTIC) 0.5 % ophthalmic solution Place 1 drop into both eyes 2 (two) times daily.   Yes Historical Provider, MD  vitamin B-12 (CYANOCOBALAMIN) 1000 MCG tablet Take 1,000 mcg by mouth daily.   Yes Historical Provider, MD    Physical Exam:  Filed Vitals:   06/08/12 1300 06/08/12 1330 06/08/12 1531 06/08/12 1745  BP: 75/49 84/53 100/60 93/56  Pulse: 80  84 76  Temp:    97.5 F (36.4 C)  Resp:   15 17  SpO2: 92%  93% 97%    Constitutional: Vital signs reviewed. Elderly thin  built female lying in bed in no acute distress Head: Normocephalic and atraumatic Mouth: Poor dentition, dry oral mucosa Eyes: blind both eyes, PERRL, conjunctivae normal, No scleral icterus.  Neck: Supple, Trachea midline normal ROM, No JVD, mass, thyromegaly,  Cardiovascular: S1 and S2 irregular, no MRG, pulses symmetric and intact bilaterally Pulmonary/Chest: CTAB, no wheezes, rales, or rhonchi Abdominal: Soft. Distention over right mid quadrant with an asymmetric bulge, tender to pressure but no guarding or rigidity. Bowel sounds present  GU: no CVA tenderness Musculoskeletal: No joint deformities, erythema, or stiffness, ROM full and no nontender Ext: no edema and no cyanosis, pulses palpable bilaterally  Hematology: no cervical, inginal, or axillary adenopathy.  Neurological: AAO x1, normal strength in all extremities Skin: Warm, dry and intact. No rash, cyanosis,  or clubbing.  .   Labs on Admission:  Basic Metabolic Panel:  Recent Labs Lab 06/08/12 1351  NA 123*  K 4.5  CL 91*  CO2 18*  GLUCOSE 110*  BUN 86*  CREATININE 2.14*  CALCIUM 8.5   Liver Function Tests:  Recent Labs Lab 06/08/12 1351  AST 84*  ALT 42*  ALKPHOS 95  BILITOT 0.5  PROT 6.5  ALBUMIN 2.6*    Recent Labs Lab 06/08/12 1351  LIPASE 13   No results found for this basename: AMMONIA,  in the last 168 hours CBC:  Recent Labs Lab 06/08/12 1351  WBC 15.8*  NEUTROABS 14.2*  HGB 10.7*  HCT 29.1*  MCV 85.1  PLT 329   Cardiac Enzymes:  Recent Labs Lab 06/08/12 1351  TROPONINI <0.30   BNP: No components found with this basename: POCBNP,  CBG: No results found for this basename: GLUCAP,  in the last 168 hours  Radiological Exams on Admission: Ct Abdomen Pelvis Wo Contrast  06/08/2012  *RADIOLOGY REPORT*  Clinical Data: Abdominal pain and bloating.  Renal insufficiency.  CT ABDOMEN AND PELVIS WITHOUT CONTRAST  Technique:  Multidetector CT imaging of the abdomen and pelvis was  performed following the standard protocol without intravenous contrast.  Comparison: 11/28/2010  Findings: Images which include the lung bases show bilateral dependent atelectasis.  Contrast material the distal esophagus may be related to dysmotility and/or reflux.  No focal abnormalities seen in the liver or spleen on this study performed without intravenous contrast material.  The stomach, duodenum, pancreas, and adrenal glands are unremarkable.  Tiny layering calcified stones are seen in the fundus of the gallbladder.  Moderate right-sided hydroureteronephrosis is noted.  There is a tiny stones evident in the right renal pelvis.  Mild fullness of the left intrarenal collecting system noted, but not nearly to the degree of the obstruction seen on the right.  There is no abdominal aortic aneurysm.  Dense atherosclerotic calcification is seen in the wall of the abdominal aorta.  No abdominal lymphadenopathy.  Imaging through the pelvis shows a marked amount of stool in the rectum.  The rectal vault measures 10.9 x 10.7 cm there is mild circumferential rectal wall thickening.  Mild perirectal edema is seen in the pelvic floor.  No pelvic sidewall lymphadenopathy.  Bladder is markedly distended. Sagittal views show substantial mass effect by the rectal stool volume and bladder distention may be related to a component of bladder outlet obstruction given the rectal dilatation.  No evidence for adnexal mass.  There is some trace fluid in the left para colic gutter.  There appears to be diffuse edema within the small bowel mesentery no evidence for an overt small bowel obstruction.  No substantial free fluid in the pelvis.  Surgical clips are noted in the right inguinal region.  Old left inferior pubic ramus fracture noted. T11 compression fracture results in substantial loss of vertebral body height.  Fracture lines are still visible.  Retropulsion of bony into the anterior canal results in spinal stenosis with narrowing  of the AP diameter of the canal down to about 8 mm.  IMPRESSION: Extremely large stool volume in the rectal vault with marked distention of the rectum.  There is circumferential rectal wall thickening and perirectal edema is noted within the soft tissues of the pelvic floor.  This is associated with marked bladder distention and the right greater than left hydroureteronephrosis. Sagittal images suggest that there is mass effect on the bladder base raising the question  of a component of bladder outlet obstruction related to the large stool volume in the rectum.  Compression fracture at T11 results in posterior bony retropulsion into the spinal canal, narrowing the AP diameter of the canal to approximately 8 mm.  Fracture lines are still visible suggesting this is acute to subacute and chronicity.  Cholelithiasis.  Tiny stones visible in the right renal pelvis.  Trace intraperitoneal fluid with possible edema or congestion within the small bowel mesentery.   Original Report Authenticated By: Kennith Center, M.D.    Ct Head Wo Contrast  06/08/2012  *RADIOLOGY REPORT*  Clinical Data: Abdominal pain  CT HEAD WITHOUT CONTRAST  Technique:  Contiguous axial images were obtained from the base of the skull through the vertex without contrast.  Comparison: Head CT 06/04/2012  Findings: No acute intracranial hemorrhage.  No focal mass lesion. No CT evidence of acute infarction.   No midline shift or mass effect.  No hydrocephalus.  Basilar cisterns are patent.  There is an extensive cortical atrophy and concomitant dilatation of the ventricles.  There is periventricular and subcortical white matter hypodensities.  Paranasal sinuses and mastoid air cells are clear.  Orbits are normal.  IMPRESSION:  1.  No acute intracranial findings.  2.  Stable atrophy and microvascular disease.   Original Report Authenticated By: Genevive Bi, M.D.    Dg Chest Portable 1 View  06/08/2012  *RADIOLOGY REPORT*  Clinical Data: Evaluate for  impaction  PORTABLE CHEST - 1 VIEW  Comparison: 06/04/2012; 11/27/2010  Findings: Grossly unchanged cardiac silhouette and mediastinal contours.  Lung volumes remain reduced with grossly unchanged bibasilar heterogeneous opacities.  There is persistent mild elevation of the left hemidiaphragm and mild gaseous distension of the splenic flexure of the colon.  No definite pleural effusion or pneumothorax.  Unchanged bones.  IMPRESSION:  1.  No acute cardiopulmonary disease. 2.  Persistently reduced lung volumes with grossly unchanged bibasilar opacities, left greater than right, favored to represent atelectasis.  3.  Mild gaseous distension of the colon within the imaged upper abdomen.   Original Report Authenticated By: Tacey Ruiz, MD     EKG:Afib at 84, no ST-T changes  Assessment/Plan  Altered mental status Like in the setting of dehydration and UTI.  Admit to telemetry. I will start her on IV hydration with normal saline. Started on IV Rocephin for UTI. Urine culture sent from ED.  Acute kidney injury Likely mixed  secondary to dehydration and obstructive uropathy Continue IV hydration and monitor renal function. Foley placed  in the ED and will monitor the urine output closely.    Active Problems:   A. fib New onset Continue aspirin. Check TSH. Daughter does not want further workup   Severe constipation I will place her on an aggressive bowel regimen with  Colace and MiraLax. I will also order for a Dulcolax suppository.   Hyponatremia Likely prerenal. Check urine  Lites.contine IV fluids and monitor in am  UTI  started on rocephin.  check urine cx     H/O: CVA (cerebrovascular accident) Continue aspirin    Hypertension Blood pressure currently stable. On ACE inhibitor which I will discontinue it      Hypothyroidism Continue Synthroid   DIET: Clear liquid  Code Status: DNR, daughter wishes to have minimal management to keep her comfortable and avoid any  aggressive treatment  Family Communication: daughter at bedside Disposition Plan: return to [facility], [facility] Pager [phone]  If 7PM-7AM, please contact night-coverage [email or web address]  Password [email or web address] 06/08/2012, 6:54 PM   Total time spent: 70 minutes

## 2012-06-08 NOTE — ED Notes (Signed)
IV team paged.  

## 2012-06-08 NOTE — ED Notes (Signed)
Patient transported to CT 

## 2012-06-08 NOTE — ED Notes (Signed)
BIB [facility] from [facility]. Initial call for abdominal pain. Per EMS, family desired transport to ED for evaluation re: Fall approx. 2-3 weeks ago (increasing pain since) & recent PCP diagnosis of Fecal Status Ileus (patient has not had bowel movement for many days per family. Per patient she cannot recall last bowel movement.

## 2012-06-08 NOTE — ED Notes (Signed)
MD at bedside. (Internal Medicine). 

## 2012-06-08 NOTE — ED Provider Notes (Addendum)
79 year old female was transferred from an assisted living facility because of complaints of abdominal pain. Patient is unable to give any history due to dementia. Her son-in-law is here and states that she had fallen several days ago and has been more confused than normal since then. On exam, she responds to pain but is not verbal. Lungs are clear and heart has regular rate and rhythm. Abdomen is slightly distended soft with mild to moderate tenderness diffusely. Maximum tenderness seems to be localized to the left side of the abdomen. No masses are felt. Bowel sounds are decreased. While in the emergency department, she has developed hypotension with blood pressure dropping into the low 80s. She is not tachycardic. At this point, IV access has not been able to be established and IV team has been called. She is DO NOT RESUSCITATE, so I would be very hesitant to start a central line on her.  Lactic acid is normal but WBC is elevated. She has significant hyponatremia and prerenal azotemia as well as a normal anion gap metabolic acidosis. ECG shows atrial fibrillation which apparently is new-not present on prior ECGs. However, all lactic acid, very unlikely that she has ischemic bowel. CT is still pending. IV team has successfully started a peripheral IV line and she is being given IV hydration. Blood pressure has risen with IV hydration. I have informed the patient's daughter of the results thus far and she confirms that she primarily wants comfort care.  Results for orders placed during the hospital encounter of 06/08/12  CBC WITH DIFFERENTIAL      Result Value Range   WBC 15.8 (*) 4.0 - 10.5 K/uL   RBC 3.42 (*) 3.87 - 5.11 MIL/uL   Hemoglobin 10.7 (*) 12.0 - 15.0 g/dL   HCT 19.1 (*) 47.8 - 29.5 %   MCV 85.1  78.0 - 100.0 fL   MCH 31.3  26.0 - 34.0 pg   MCHC 36.8 (*) 30.0 - 36.0 g/dL   RDW 62.1  30.8 - 65.7 %   Platelets 329  150 - 400 K/uL   Neutrophils Relative 90 (*) 43 - 77 %   Neutro Abs 14.2  (*) 1.7 - 7.7 K/uL   Lymphocytes Relative 3 (*) 12 - 46 %   Lymphs Abs 0.5 (*) 0.7 - 4.0 K/uL   Monocytes Relative 7  3 - 12 %   Monocytes Absolute 1.1 (*) 0.1 - 1.0 K/uL   Eosinophils Relative 0  0 - 5 %   Eosinophils Absolute 0.0  0.0 - 0.7 K/uL   Basophils Relative 0  0 - 1 %   Basophils Absolute 0.0  0.0 - 0.1 K/uL  COMPREHENSIVE METABOLIC PANEL      Result Value Range   Sodium 123 (*) 135 - 145 mEq/L   Potassium 4.5  3.5 - 5.1 mEq/L   Chloride 91 (*) 96 - 112 mEq/L   CO2 18 (*) 19 - 32 mEq/L   Glucose, Bld 110 (*) 70 - 99 mg/dL   BUN 86 (*) 6 - 23 mg/dL   Creatinine, Ser 8.46 (*) 0.50 - 1.10 mg/dL   Calcium 8.5  8.4 - 96.2 mg/dL   Total Protein 6.5  6.0 - 8.3 g/dL   Albumin 2.6 (*) 3.5 - 5.2 g/dL   AST 84 (*) 0 - 37 U/L   ALT 42 (*) 0 - 35 U/L   Alkaline Phosphatase 95  39 - 117 U/L   Total Bilirubin 0.5  0.3 - 1.2 mg/dL  GFR calc non Af Amer 20 (*) >90 mL/min   GFR calc Af Amer 23 (*) >90 mL/min  LIPASE, BLOOD      Result Value Range   Lipase 13  11 - 59 U/L  URINALYSIS, ROUTINE W REFLEX MICROSCOPIC      Result Value Range   Color, Urine AMBER (*) YELLOW   APPearance CLOUDY (*) CLEAR   Specific Gravity, Urine 1.019  1.005 - 1.030   pH 5.0  5.0 - 8.0   Glucose, UA NEGATIVE  NEGATIVE mg/dL   Hgb urine dipstick LARGE (*) NEGATIVE   Bilirubin Urine SMALL (*) NEGATIVE   Ketones, ur NEGATIVE  NEGATIVE mg/dL   Protein, ur 30 (*) NEGATIVE mg/dL   Urobilinogen, UA 0.2  0.0 - 1.0 mg/dL   Nitrite NEGATIVE  NEGATIVE   Leukocytes, UA MODERATE (*) NEGATIVE  TROPONIN I      Result Value Range   Troponin I <0.30  <0.30 ng/mL  URINE MICROSCOPIC-ADD ON      Result Value Range   Squamous Epithelial / LPF RARE  RARE   WBC, UA 11-20  <3 WBC/hpf   RBC / HPF 11-20  <3 RBC/hpf   Bacteria, UA FEW (*) RARE   Urine-Other AMORPHOUS URATES/PHOSPHATES    CG4 I-STAT (LACTIC ACID)      Result Value Range   Lactic Acid, Venous 0.73  0.5 - 2.2 mmol/L   Dg Chest Portable 1  View  06/08/2012  *RADIOLOGY REPORT*  Clinical Data: Evaluate for impaction  PORTABLE CHEST - 1 VIEW  Comparison: 06/04/2012; 11/27/2010  Findings: Grossly unchanged cardiac silhouette and mediastinal contours.  Lung volumes remain reduced with grossly unchanged bibasilar heterogeneous opacities.  There is persistent mild elevation of the left hemidiaphragm and mild gaseous distension of the splenic flexure of the colon.  No definite pleural effusion or pneumothorax.  Unchanged bones.  IMPRESSION:  1.  No acute cardiopulmonary disease. 2.  Persistently reduced lung volumes with grossly unchanged bibasilar opacities, left greater than right, favored to represent atelectasis.  3.  Mild gaseous distension of the colon within the imaged upper abdomen.   Original Report Authenticated By: Tacey Ruiz, MD        Date: 06/08/2012  Rate: 84  Rhythm: atrial fibrillation  QRS Axis: normal  Intervals: normal  ST/T Wave abnormalities: normal  Conduction Disutrbances:none  Narrative Interpretation: Atrial fibrillation with controlled ventricular response, counterclockwise rotation. When compared with ECG of 10/01/2011, atrial fibrillation has replaced sinus rhythm.  Old EKG Reviewed: changes noted  CRITICAL CARE Performed by: FAOZH,YQMVH Total critical care time: 35 minutes Critical care time was exclusive of separately billable procedures and treating other patients. Critical care was necessary to treat or prevent imminent or life-threatening deterioration. Critical care was time spent personally by me on the following activities: development of treatment plan with patient and/or surrogate as well as nursing, discussions with consultants, evaluation of patient's response to treatment, examination of patient, obtaining history from patient or surrogate, ordering and performing treatments and interventions, ordering and review of laboratory studies, ordering and review of radiographic studies, pulse oximetry  and re-evaluation of patient's condition.   Medical screening examination/treatment/procedure(s) were conducted as a shared visit with non-physician practitioner(s) and myself.  I personally evaluated the patient during the encounter   Dione Booze, MD 06/08/12 1612  Dione Booze, MD 06/09/12 1416

## 2012-06-09 DIAGNOSIS — K5641 Fecal impaction: Secondary | ICD-10-CM | POA: Diagnosis present

## 2012-06-09 DIAGNOSIS — H547 Unspecified visual loss: Secondary | ICD-10-CM

## 2012-06-09 LAB — CBC
MCV: 87 fL (ref 78.0–100.0)
Platelets: 343 10*3/uL (ref 150–400)
RBC: 3.24 MIL/uL — ABNORMAL LOW (ref 3.87–5.11)
RDW: 13.5 % (ref 11.5–15.5)
WBC: 12.4 10*3/uL — ABNORMAL HIGH (ref 4.0–10.5)

## 2012-06-09 LAB — BASIC METABOLIC PANEL
CO2: 17 mEq/L — ABNORMAL LOW (ref 19–32)
Chloride: 99 mEq/L (ref 96–112)
GFR calc Af Amer: 51 mL/min — ABNORMAL LOW (ref >90)
Potassium: 4 mEq/L (ref 3.5–5.1)
Sodium: 130 mEq/L — ABNORMAL LOW (ref 135–145)

## 2012-06-09 MED ORDER — SODIUM CHLORIDE 0.9 % IV SOLN
1.5000 g | Freq: Three times a day (TID) | INTRAVENOUS | Status: DC
  Administered 2012-06-09 – 2012-06-10 (×3): 1.5 g via INTRAVENOUS
  Filled 2012-06-09 (×5): qty 1.5

## 2012-06-09 MED ORDER — ENSURE COMPLETE PO LIQD
237.0000 mL | Freq: Two times a day (BID) | ORAL | Status: DC
  Administered 2012-06-10 – 2012-06-14 (×6): 237 mL via ORAL

## 2012-06-09 MED ORDER — FLEET ENEMA 7-19 GM/118ML RE ENEM
1.0000 | ENEMA | Freq: Every day | RECTAL | Status: DC | PRN
  Filled 2012-06-09: qty 1

## 2012-06-09 NOTE — Care Management Note (Unsigned)
    Page 1 of 1   06/09/2012     4:07:07 PM   CARE MANAGEMENT NOTE 06/09/2012  Patient:  Diana [name], Diana [name]   Account Number:  [id number]  Date Initiated:  06/09/2012  Documentation initiated by:  Reannah Totten  Subjective/Objective Assessment:   PT ADM ON 06/08/12 WITH SEVERE CONSTIPATION, UTI, DEHYDRATION.  PTA, PT RESIDES [facility].  SHE HAS DEMENTIA.     Action/Plan:   WILL CONSULT CSW TO FACILITATE RETURN TO ALF WHEN MEDICALLY STABLE FOR DC.  WILL FOLLOW.   Anticipated DC Date:  06/11/2012   Anticipated DC Plan:  ASSISTED LIVING / REST HOME  In-house referral  Clinical Social Worker      DC Planning Services  CM consult      Choice offered to / List presented to:             Status of service:  In process, will continue to follow Medicare Important Message given?   (If response is "NO", the following Medicare IM given date fields will be blank) Date Medicare IM given:   Date Additional Medicare IM given:    Discharge Disposition:    Per UR Regulation:  Reviewed for med. necessity/level of care/duration of stay  If discussed at Long Length of Stay Meetings, dates discussed:    Comments:

## 2012-06-09 NOTE — Clinical Documentation Improvement (Signed)
SEPSIS DOCUMENTATION QUERY  THIS DOCUMENT IS NOT A PERMANENT PART OF THE MEDICAL RECORD  TO RESPOND TO THE THIS QUERY, FOLLOW THE INSTRUCTIONS BELOW:  1. If needed, update documentation for the patient's encounter via the notes activity.  2. Access this query again and click edit on the In [employer].  3. After updating, or not, click F2 to complete all highlighted (required) fields concerning your review. Select "additional documentation in the medical record" OR "no additional documentation provided".  4. Click Sign note button.  5. The deficiency will fall out of your In Basket *Please let us know if you are not able to complete this workflow by phone or e-mail (listed below).  Please update your documentation within the medical record to reflect your response to this query.                                                                                    06/09/12  Dear Dr. Lavera Guise Marton Redwood,  In a better effort to capture your patient's severity of illness, reflect appropriate length of stay and utilization of resources, a review of the patient medical record has revealed the following indicators.    Based on your clinical judgment, please clarify and document in a progress note and/or discharge summary the clinical condition associated with the following supporting information:  In responding to this query please exercise your independent judgment.  The fact that a query is asked, does not imply that any particular answer is desired or expected.     Possible Clinical Conditions?  Septicemia / Sepsis  Severe Sepsis  Septic Shock  Sepsis with UTI  Other Condition   Cannot clinically Determine     Risk Factors: Hypotension while in the ED per 5/07 note; BP's per 5/07 doc flowsheets: 75-95/49-53.  O2 sat down to 92% per 5/07 doc flowsheets. Metabolic acidosis noted while in the ED per 5/07 note. UA showing UTI per 5/07 ED note. Probable toxic metabolic  encephalopathy noted per 5/08 progress notes.     Reviewed: additional documentation in the medical record  Thank You,  Marciano Sequin,  Clinical Documentation Specialist:  Phone: [phone]  Health Information Management [place]

## 2012-06-09 NOTE — Progress Notes (Signed)
[facility] PROGRESS NOTE  ANALIZ TVEDT NWG:[id number] DOB: 1925/07/06 DOA: 06/08/2012 PCP: Florentina Jenny, MD  Assessment/Plan: Altered mental status - probably toxic metabolic encephalopathy  -  in the setting of dehydration and UTI.  - improved   Acute kidney injury  Likely mixed secondary to dehydration and obstructive uropathy  Placed on IV hydration on admission - urinary output OK and monitor renal function. Foley placed in the ED and will monitor the urine output closely to assure she does not develop postobsrtuctive polyuria.   Atrial fibrillation   New Onset  Continue aspirin.  Severe constipation and fecal impaction  Resolved with good bowel regimen with Colace and MiraLax.  Enterococcus UTI  started on rocephin. After culture results available - patient was changed to iv unasyn.   H/O: CVA (cerebrovascular accident)  Continue aspirin   Hypertension   Hypothyroidism  Continue Synthroid   Code Status: dnr Family Communication: daughter  Disposition Plan: alf    Consultants:    Procedures:    HPI/Subjective: arousable   Objective: Filed Vitals:   06/08/12 1745 06/08/12 1900 06/08/12 2014 06/09/12 0446  BP: 93/56  95/53 124/106  Pulse: 76  84 81  Temp: 97.5 F (36.4 C)  97.6 F (36.4 C) 98.4 F (36.9 C)  TempSrc:   Oral Oral  Resp: 17  18 18   Height:  5\' 9"  (1.753 m)    Weight:   63.7 kg (140 lb 6.9 oz) 63.6 kg (140 lb 3.4 oz)  SpO2: 97%  97% 92%    Intake/Output Summary (Last 24 hours) at 06/09/12 0854 Last data filed at 06/09/12 0645  Gross per 24 hour  Intake   1320 ml  Output   2001 ml  Net   -681 ml   Filed Weights   06/08/12 2014 06/09/12 0446  Weight: 63.7 kg (140 lb 6.9 oz) 63.6 kg (140 lb 3.4 oz)    Exam:   General:  Blind, no acute respiratory distress   Cardiovascular: irreg irreg   Respiratory: ctab   Abdomen: distended , soft   Musculoskeletal: intact    Data Reviewed: Basic Metabolic  Panel:  Recent Labs Lab 06/08/12 1351 06/09/12 0457  NA 123* 130*  K 4.5 4.0  CL 91* 99  CO2 18* 17*  GLUCOSE 110* 95  BUN 86* 52*  CREATININE 2.14* 1.11*  CALCIUM 8.5 7.9*   Liver Function Tests:  Recent Labs Lab 06/08/12 1351  AST 84*  ALT 42*  ALKPHOS 95  BILITOT 0.5  PROT 6.5  ALBUMIN 2.6*    Recent Labs Lab 06/08/12 1351  LIPASE 13   No results found for this basename: AMMONIA,  in the last 168 hours CBC:  Recent Labs Lab 06/08/12 1351 06/09/12 0457  WBC 15.8* 12.4*  NEUTROABS 14.2*  --   HGB 10.7* 10.0*  HCT 29.1* 28.2*  MCV 85.1 87.0  PLT 329 343   Cardiac Enzymes:  Recent Labs Lab 06/08/12 1351  TROPONINI <0.30   BNP (last 3 results) No results found for this basename: PROBNP,  in the last 8760 hours CBG: No results found for this basename: GLUCAP,  in the last 168 hours  Recent Results (from the past 240 hour(s))  MRSA PCR SCREENING     Status: None   Collection Time    06/08/12 10:02 PM      Result Value Range Status   MRSA by PCR NEGATIVE  NEGATIVE Final   Comment:  The GeneXpert MRSA Assay (FDA     approved for NASAL specimens     only), is one component of a     comprehensive MRSA colonization     surveillance program. It is not     intended to diagnose MRSA     infection nor to guide or     monitor treatment for     MRSA infections.     Studies: Ct Abdomen Pelvis Wo Contrast  06/08/2012  *RADIOLOGY REPORT*  Clinical Data: Abdominal pain and bloating.  Renal insufficiency.  CT ABDOMEN AND PELVIS WITHOUT CONTRAST  Technique:  Multidetector CT imaging of the abdomen and pelvis was performed following the standard protocol without intravenous contrast.  Comparison: 11/28/2010  Findings: Images which include the lung bases show bilateral dependent atelectasis.  Contrast material the distal esophagus may be related to dysmotility and/or reflux.  No focal abnormalities seen in the liver or spleen on this study performed  without intravenous contrast material.  The stomach, duodenum, pancreas, and adrenal glands are unremarkable.  Tiny layering calcified stones are seen in the fundus of the gallbladder.  Moderate right-sided hydroureteronephrosis is noted.  There is a tiny stones evident in the right renal pelvis.  Mild fullness of the left intrarenal collecting system noted, but not nearly to the degree of the obstruction seen on the right.  There is no abdominal aortic aneurysm.  Dense atherosclerotic calcification is seen in the wall of the abdominal aorta.  No abdominal lymphadenopathy.  Imaging through the pelvis shows a marked amount of stool in the rectum.  The rectal vault measures 10.9 x 10.7 cm there is mild circumferential rectal wall thickening.  Mild perirectal edema is seen in the pelvic floor.  No pelvic sidewall lymphadenopathy.  Bladder is markedly distended. Sagittal views show substantial mass effect by the rectal stool volume and bladder distention may be related to a component of bladder outlet obstruction given the rectal dilatation.  No evidence for adnexal mass.  There is some trace fluid in the left para colic gutter.  There appears to be diffuse edema within the small bowel mesentery no evidence for an overt small bowel obstruction.  No substantial free fluid in the pelvis.  Surgical clips are noted in the right inguinal region.  Old left inferior pubic ramus fracture noted. T11 compression fracture results in substantial loss of vertebral body height.  Fracture lines are still visible.  Retropulsion of bony into the anterior canal results in spinal stenosis with narrowing of the AP diameter of the canal down to about 8 mm.  IMPRESSION: Extremely large stool volume in the rectal vault with marked distention of the rectum.  There is circumferential rectal wall thickening and perirectal edema is noted within the soft tissues of the pelvic floor.  This is associated with marked bladder distention and the right  greater than left hydroureteronephrosis. Sagittal images suggest that there is mass effect on the bladder base raising the question of a component of bladder outlet obstruction related to the large stool volume in the rectum.  Compression fracture at T11 results in posterior bony retropulsion into the spinal canal, narrowing the AP diameter of the canal to approximately 8 mm.  Fracture lines are still visible suggesting this is acute to subacute and chronicity.  Cholelithiasis.  Tiny stones visible in the right renal pelvis.  Trace intraperitoneal fluid with possible edema or congestion within the small bowel mesentery.   Original Report Authenticated By: Kennith Center, M.D.  Ct Head Wo Contrast  06/08/2012  *RADIOLOGY REPORT*  Clinical Data: Abdominal pain  CT HEAD WITHOUT CONTRAST  Technique:  Contiguous axial images were obtained from the base of the skull through the vertex without contrast.  Comparison: Head CT 06/04/2012  Findings: No acute intracranial hemorrhage.  No focal mass lesion. No CT evidence of acute infarction.   No midline shift or mass effect.  No hydrocephalus.  Basilar cisterns are patent.  There is an extensive cortical atrophy and concomitant dilatation of the ventricles.  There is periventricular and subcortical white matter hypodensities.  Paranasal sinuses and mastoid air cells are clear.  Orbits are normal.  IMPRESSION:  1.  No acute intracranial findings.  2.  Stable atrophy and microvascular disease.   Original Report Authenticated By: Genevive Bi, M.D.    Dg Chest Portable 1 View  06/08/2012  *RADIOLOGY REPORT*  Clinical Data: Evaluate for impaction  PORTABLE CHEST - 1 VIEW  Comparison: 06/04/2012; 11/27/2010  Findings: Grossly unchanged cardiac silhouette and mediastinal contours.  Lung volumes remain reduced with grossly unchanged bibasilar heterogeneous opacities.  There is persistent mild elevation of the left hemidiaphragm and mild gaseous distension of the splenic  flexure of the colon.  No definite pleural effusion or pneumothorax.  Unchanged bones.  IMPRESSION:  1.  No acute cardiopulmonary disease. 2.  Persistently reduced lung volumes with grossly unchanged bibasilar opacities, left greater than right, favored to represent atelectasis.  3.  Mild gaseous distension of the colon within the imaged upper abdomen.   Original Report Authenticated By: Tacey Ruiz, MD     Scheduled Meds: . aspirin EC  81 mg Oral q morning - 10a  . cefTRIAXone (ROCEPHIN)  IV  1 g Intravenous Q24H  . cholecalciferol  1,000 Units Oral q morning - 10a  . enoxaparin (LOVENOX) injection  30 mg Subcutaneous Q24H  . escitalopram  5 mg Oral q morning - 10a  . levothyroxine  75 mcg Oral QAC breakfast  . pilocarpine  1 drop Both Eyes QID  . polyethylene glycol  17 g Oral Daily  . senna-docusate  2 tablet Oral BID  . sodium chloride  3 mL Intravenous Q12H  . timolol  1 drop Both Eyes BID  . vitamin B-12  1,000 mcg Oral Daily   Continuous Infusions: . sodium chloride 100 mL/hr at 06/08/12 2135    Active Problems:   H/O: CVA (cerebrovascular accident)   Hypertension   Blindness and low vision   Dementia   Hypothyroidism   Fall at nursing home   UTI (urinary tract infection)   Hydronephrosis   Constipation    Diana [name]  [facility] Pager [phone]. If 7PM-7AM, please contact night-coverage at [email or web address], password [facility] 06/09/2012, 8:54 AM  LOS: 1 day

## 2012-06-09 NOTE — Progress Notes (Addendum)
INITIAL NUTRITION ASSESSMENT  DOCUMENTATION CODES Per approved criteria  -Not Applicable   INTERVENTION:  Ensure Complete twice daily (350 kcals, 13 gm protein per 8 fl oz bottle) RD to follow for nutrition care plan  NUTRITION DIAGNOSIS: Inadequate oral intake related to altered mental status as evidenced by PO intake 10%  Goal: Oral intake with meals & supplements to meet >/= 90% of estimated nutrition needs  Monitor:  PO & supplemental intake, weight, labs, I/O's  Reason for Assessment: Malnutrition Screening Tool Report  79 y.o. female  Admitting Dx: Malnutrition Screening Tool Report  ASSESSMENT: Patient history of stroke living in a NH was brought to the hospital by her daughter for further mental status for possible to 3 days; head CT was done which was unremarkable; abdominal CT done showing large stool volume with rectal distention.  RD unable to obtain nutrition hx from patient; confused; PO intake very poor at 10% per flowsheet records; patient with visible subcutaneous fat loss to clavicles (protruding, prominent bone); would benefit from addition of nutrition supplements ---> RD to order.  RD suspects some level of malnutrition, however, unable to identify at this time.  Height: Ht Readings from Last 1 Encounters:  06/08/12 5\' 9"  (1.753 m)    Weight: Wt Readings from Last 1 Encounters:  06/09/12 140 lb 3.4 oz (63.6 kg)    Ideal Body Weight: 145 lb  % Ideal Body Weight: 96%  Wt Readings from Last 10 Encounters:  06/09/12 140 lb 3.4 oz (63.6 kg)    Usual Body Weight: unable to obtain  % Usual Body Weight: ---  BMI:  Body mass index is 20.7 kg/(m^2).  Estimated Nutritional Needs: Kcal: 1500-1700 Protein: 70-80 gm Fluid: 1.5-1.7 L  Skin: Stage II pressure ulcer to peri-rectal area  Diet Order: Dysphagia 3, thin liquids  EDUCATION NEEDS: -No education needs identified at this time   Intake/Output Summary (Last 24 hours) at 06/09/12  1559 Last data filed at 06/09/12 1300  Gross per 24 hour  Intake   1540 ml  Output   2800 ml  Net  -1260 ml    Last BM: 5/8  Labs:   Recent Labs Lab 06/08/12 1351 06/09/12 0457  NA 123* 130*  K 4.5 4.0  CL 91* 99  CO2 18* 17*  BUN 86* 52*  CREATININE 2.14* 1.11*  CALCIUM 8.5 7.9*  GLUCOSE 110* 95    Scheduled Meds: . ampicillin-sulbactam (UNASYN) IV  1.5 g Intravenous Q8H  . aspirin EC  81 mg Oral q morning - 10a  . cholecalciferol  1,000 Units Oral q morning - 10a  . enoxaparin (LOVENOX) injection  30 mg Subcutaneous Q24H  . escitalopram  5 mg Oral q morning - 10a  . levothyroxine  75 mcg Oral QAC breakfast  . pilocarpine  1 drop Both Eyes QID  . polyethylene glycol  17 g Oral Daily  . senna-docusate  2 tablet Oral BID  . sodium chloride  3 mL Intravenous Q12H  . timolol  1 drop Both Eyes BID  . vitamin B-12  1,000 mcg Oral Daily    Continuous Infusions: . sodium chloride 100 mL/hr at 06/08/12 2135    Past Medical History  Diagnosis Date  . Thyroid disease   . Hypertension   . Blind   . Anxiety   . Depression   . CVA (cerebral vascular accident)     Past Surgical History  Procedure Laterality Date  . Appendectomy    . Abdominal hysterectomy    .  Eye surgery      Maureen Chatters, RD, LDN Pager #: [phone] After-Hours Pager #: [phone]

## 2012-06-09 NOTE — Progress Notes (Signed)
Utilization Review Completed.Diana [name] T5/09/2012  

## 2012-06-10 DIAGNOSIS — F0390 Unspecified dementia without behavioral disturbance: Secondary | ICD-10-CM

## 2012-06-10 DIAGNOSIS — A419 Sepsis, unspecified organism: Secondary | ICD-10-CM | POA: Diagnosis present

## 2012-06-10 LAB — URINE CULTURE: Colony Count: >=100000

## 2012-06-10 LAB — CBC
HCT: 33.8 % — ABNORMAL LOW (ref 36.0–46.0)
Hemoglobin: 11.4 g/dL — ABNORMAL LOW (ref 12.0–15.0)
MCHC: 33.7 g/dL (ref 30.0–36.0)
MCV: 91.8 fL (ref 78.0–100.0)

## 2012-06-10 LAB — TSH: TSH: 7.397 u[IU]/mL — ABNORMAL HIGH (ref 0.350–4.500)

## 2012-06-10 LAB — BASIC METABOLIC PANEL
BUN: 28 mg/dL — ABNORMAL HIGH (ref 6–23)
Chloride: 104 mEq/L (ref 96–112)
Creatinine, Ser: 0.69 mg/dL (ref 0.50–1.10)
GFR calc non Af Amer: 78 mL/min — ABNORMAL LOW (ref >90)
Glucose, Bld: 96 mg/dL (ref 70–99)
Potassium: 4 mEq/L (ref 3.5–5.1)

## 2012-06-10 MED ORDER — AMOXICILLIN 500 MG PO CAPS
500.0000 mg | ORAL_CAPSULE | Freq: Two times a day (BID) | ORAL | Status: DC
  Administered 2012-06-10 – 2012-06-14 (×9): 500 mg via ORAL
  Filled 2012-06-10 (×10): qty 1

## 2012-06-10 MED ORDER — SODIUM CHLORIDE 0.9 % IV SOLN
INTRAVENOUS | Status: DC
  Administered 2012-06-10: 19:00:00 via INTRAVENOUS

## 2012-06-10 NOTE — Progress Notes (Signed)
Pt admitted from [facility] and returning there.  Spoke with nurse there that was involved in her care.  Nurse stated pt needed extensive assist with all adls due to her severe dementia.  Will defer this eval back to ALF at this time. Tory Emerald,  [phone]

## 2012-06-10 NOTE — Clinical Social Work Note (Signed)
Clinical Social Work Department BRIEF PSYCHOSOCIAL ASSESSMENT 06/10/2012  Patient:  Diana [name], Diana [name]     Account Number:  [id number]     Admit date:  06/08/2012  Clinical Social Worker:  Verl Blalock  Date/Time:  06/10/2012 04:18 PM  Referred by:  Physician  Date Referred:  06/10/2012 Referred for  ALF Placement   Other Referral:   Return to [facility]   Interview type:  Family Other interview type:   Patient with severe Dementia    PSYCHOSOCIAL DATA Living Status:  FACILITY Admitted from facility:  [facility] Level of care:  Assisted Living Primary support name:  [name],Diana  [phone] Primary support relationship to patient:  CHILD, ADULT Degree of support available:   Strong    CURRENT CONCERNS Current Concerns  Post-Acute Placement   Other Concerns:    SOCIAL WORK ASSESSMENT / PLAN Clinical Social Worker met with patient daughter in the hallway to offer support and discuss patient plans at discharge.  Patient daughter states that patient and her husband are both residents at [facility].  Patient requires extensive assistance with all ADL's prior to admission - currently at baseline per therapies.  CSW to complete FL2 and communicate with facility regarding patient return.  Patient daughter states that patient will transport by ambulance at discharge.  CSW to remain available for support and to facilitate patient discharge needs once medically stable.   Assessment/plan status:  Psychosocial Support/Ongoing Assessment of Needs Other assessment/ plan:   Information/referral to community resources:   Patient daughter states that she loves having both her parents at [facility] and has no desire for further resources.    PATIENT'S/FAMILY'S RESPONSE TO PLAN OF CARE: Patient with severe dementia, however patient daughter present in the room.  CSW spoke with patient daughter in the hallway to confirm patient plans.  Patient daughter is  planning on patient return to [facility] via ambulance transport.  Patient daughter verbally expressed her appreciation for CSW support and involvement.    Macario Golds, [place] [phone]

## 2012-06-10 NOTE — Evaluation (Signed)
Physical Therapy Evaluation Patient Details Name: Diana [name] MRN: [id number] DOB: 24-Oct-1925 Today's Date: 06/10/2012 Time: 4098-1191 PT Time Calculation (min): 21 min  PT Assessment / Plan / Recommendation Clinical Impression    PT ADM ON 06/08/12 WITH SEVERE CONSTIPATION, UTI, DEHYDRATION. PTA, PT RESIDES AT [facility]. SHE HAS DEMENTIA. Currently patient presents with deficits in functional mobility secondary weakness, deconditioning, and cognitive deficits. Per daughter, patient was able to ambulate short distances with rw PTA. Will trial attempt PT to address deficits and maximize function. Rec dc back to [facility] upon discharge.    PT Assessment  Patient needs continued PT services    Follow Up Recommendations  SNF    Does the patient have the potential to tolerate intense rehabilitation      Barriers to Discharge None      Equipment Recommendations  None recommended by PT    Recommendations for Other Services     Frequency Min 2X/week    Precautions / Restrictions Restrictions Weight Bearing Restrictions: No   Pertinent Vitals/Pain No pain at this time      Mobility  Bed Mobility Bed Mobility: Supine to Sit;Sitting - Scoot to Edge of Bed Supine to Sit: 3: Mod assist Sitting - Scoot to Edge of Bed: 3: Mod assist Details for Bed Mobility Assistance: Assist to elevate trunk and rotate hips, pt did initiate LE movement throughout Transfers Transfers: Sit to Stand;Stand to Sit Sit to Stand: 1: +2 Total assist Sit to Stand: Patient Percentage: 60% Stand to Sit: 1: +2 Total assist Stand to Sit: Patient Percentage: 60% Details for Transfer Assistance: Assist to elevate to standing with bilateral HHA Ambulation/Gait Ambulation/Gait Assistance: 1: +2 Total assist Ambulation/Gait: Patient Percentage: 60% Ambulation Distance (Feet): 12 Feet Assistive device: 2 person hand held assist (will try rw) Ambulation/Gait Assistance  Details: Pt required VCs for encouragement and assist secondary to weakness and blindness, Tendency to push posteriorly. Gait Pattern: Step-to pattern;Shuffle;Narrow base of support Gait velocity: decreased      PT Goals Acute Rehab PT Goals PT Goal Formulation: With family Time For Goal Achievement: 06/24/12 Potential to Achieve Goals: Fair Pt will Transfer Bed to Chair/Chair to Bed: with min assist PT Transfer Goal: Bed to Chair/Chair to Bed - Progress: Goal set today Pt will Stand: with min assist PT Goal: Stand - Progress: Goal set today Pt will Ambulate: 51 - 150 feet;with min assist;with rolling walker PT Goal: Ambulate - Progress: Goal set today  Visit Information  Last PT Received On: 06/10/12 Assistance Needed: +2    Subjective Data  Subjective: Turn the TV on Patient Stated Goal: to go back to [facility]   Prior Functioning  Home Living Available Help at Discharge: Skilled Nursing Facility Type of Home: Assisted living Prior Function Level of Independence: Needs assistance    Cognition  Cognition Arousal/Alertness: Lethargic Behavior During Therapy: WFL for tasks assessed/performed Overall Cognitive Status: History of cognitive impairments - at baseline    Extremity/Trunk Assessment Right Upper Extremity Assessment RUE ROM/Strength/Tone: [facility] for tasks assessed Left Upper Extremity Assessment LUE ROM/Strength/Tone: WFL for tasks assessed Right Lower Extremity Assessment RLE ROM/Strength/Tone: Deficits RLE ROM/Strength/Tone Deficits: generalized weakness Left Lower Extremity Assessment LLE ROM/Strength/Tone: Deficits LLE ROM/Strength/Tone Deficits: generalized weakness   Balance Balance Balance Assessed: Yes Static Sitting Balance Static Sitting - Balance Support: Feet supported Static Sitting - Level of Assistance: 4: Min assist Static Sitting - Comment/# of Minutes: min assist intially but with some VCs patient able  to correct trunk and maintain  static position  End of Session PT - End of Session Equipment Utilized During Treatment: Gait belt Activity Tolerance: Patient tolerated treatment well Patient left: in chair;with call bell/phone within reach;with family/visitor present Nurse Communication: Mobility status  GP     Fabio Asa 06/10/2012, 3:39 PM Charlotte Crumb, PT DPT  [phone]

## 2012-06-10 NOTE — Progress Notes (Signed)
Pt noted to be choking liquids and coughing during dinner. Pt was in bed with HOB at 60 degrees. No more food offered to pt. MD paged and new orders received to make pt NPO, bedside swallow eval, and restart IVF. Will continue to monitor pt closely.

## 2012-06-10 NOTE — Progress Notes (Signed)
[facility] PROGRESS NOTE  Diana [name] ZOX:[id number] DOB: Jan 22, 1926 DOA: 06/08/2012 PCP: Florentina Jenny, MD  Assessment/Plan: Severe Sepsis on admission - as proven by leukocytosis, hypotension, evidence of infection, multiorgan dysfunction,. Source urinary tract infection. Resolved    Altered mental status - probably toxic metabolic encephalopathy  -  in the setting of dehydration and UTI.  - improved   Acute kidney injury  Likely mixed secondary to dehydration and obstructive uropathy  Placed on IV hydration on admission - urinary output OK and monitor renal function. Foley placed in the ED and will monitor the urine output closely to assure she does not develop postobsrtuctive polyuria. Urine output stabilized by May 9 and the patient's Foley catheter was removed  Atrial fibrillation   New Onset  Continue aspirin. Patient is not a candidate for anticoagulation since she is unstable on her feet and has a high risk for falls  Severe constipation and fecal impaction  Resolved with good bowel regimen with Colace and MiraLax.  Enterococcus UTI  started on rocephin. After culture results available - patient was changed to iv unasyn on May 8. Patient continued to improved and we changed to oral amoxicillin on May 9  H/O: CVA (cerebrovascular accident)  Continue aspirin   Hypertension   Hypothyroidism  Continue Synthroid   Code Status: dnr Family Communication: daughter  Disposition Plan: alf    Consultants:    Procedures:    HPI/Subjective: Up in a recliner  Objective: Filed Vitals:   06/08/12 2014 06/09/12 0446 06/09/12 1952 06/10/12 0454  BP: 95/53 124/106 92/54 129/78  Pulse: 84 81 85 82  Temp: 97.6 F (36.4 C) 98.4 F (36.9 C) 97.3 F (36.3 C) 98.2 F (36.8 C)  TempSrc: Oral Oral Oral Oral  Resp: 18 18 18 18   Height:      Weight: 63.7 kg (140 lb 6.9 oz) 63.6 kg (140 lb 3.4 oz)  63.7 kg (140 lb 6.9 oz)  SpO2: 97% 92% 95% 97%     Intake/Output Summary (Last 24 hours) at 06/10/12 1024 Last data filed at 06/09/12 1955  Gross per 24 hour  Intake    340 ml  Output   1050 ml  Net   -710 ml   Filed Weights   06/08/12 2014 06/09/12 0446 06/10/12 0454  Weight: 63.7 kg (140 lb 6.9 oz) 63.6 kg (140 lb 3.4 oz) 63.7 kg (140 lb 6.9 oz)    Exam:   General:  Blind, not oriented to person  Cardiovascular: irreg irreg   Respiratory: ctab   Abdomen: distended , soft   Musculoskeletal: intact    Data Reviewed: Basic Metabolic Panel:  Recent Labs Lab 06/08/12 1351 06/09/12 0457 06/10/12 0515  NA 123* 130* 134*  K 4.5 4.0 4.0  CL 91* 99 104  CO2 18* 17* 18*  GLUCOSE 110* 95 96  BUN 86* 52* 28*  CREATININE 2.14* 1.11* 0.69  CALCIUM 8.5 7.9* 8.3*   Liver Function Tests:  Recent Labs Lab 06/08/12 1351  AST 84*  ALT 42*  ALKPHOS 95  BILITOT 0.5  PROT 6.5  ALBUMIN 2.6*    Recent Labs Lab 06/08/12 1351  LIPASE 13   No results found for this basename: AMMONIA,  in the last 168 hours CBC:  Recent Labs Lab 06/08/12 1351 06/09/12 0457 06/10/12 0515  WBC 15.8* 12.4* 10.5  NEUTROABS 14.2*  --   --   HGB 10.7* 10.0* 11.4*  HCT 29.1* 28.2* 33.8*  MCV 85.1 87.0 91.8  PLT  329 343 309   Cardiac Enzymes:  Recent Labs Lab 06/08/12 1351  TROPONINI <0.30   BNP (last 3 results) No results found for this basename: PROBNP,  in the last 8760 hours CBG: No results found for this basename: GLUCAP,  in the last 168 hours  Recent Results (from the past 240 hour(s))  URINE CULTURE     Status: None   Collection Time    06/08/12  3:16 PM      Result Value Range Status   Specimen Description URINE, CATHETERIZED   Final   Special Requests NONE   Final   Culture  Setup Time 06/08/2012 16:58   Final   Colony Count >=100,000 COLONIES/ML   Final   Culture ENTEROCOCCUS SPECIES   Final   Report Status 06/10/2012 FINAL   Final   Organism ID, Bacteria ENTEROCOCCUS SPECIES   Final  MRSA PCR SCREENING      Status: None   Collection Time    06/08/12 10:02 PM      Result Value Range Status   MRSA by PCR NEGATIVE  NEGATIVE Final   Comment:            The GeneXpert MRSA Assay (FDA     approved for NASAL specimens     only), is one component of a     comprehensive MRSA colonization     surveillance program. It is not     intended to diagnose MRSA     infection nor to guide or     monitor treatment for     MRSA infections.     Studies: Ct Abdomen Pelvis Wo Contrast  06/08/2012  *RADIOLOGY REPORT*  Clinical Data: Abdominal pain and bloating.  Renal insufficiency.  CT ABDOMEN AND PELVIS WITHOUT CONTRAST  Technique:  Multidetector CT imaging of the abdomen and pelvis was performed following the standard protocol without intravenous contrast.  Comparison: 11/28/2010  Findings: Images which include the lung bases show bilateral dependent atelectasis.  Contrast material the distal esophagus may be related to dysmotility and/or reflux.  No focal abnormalities seen in the liver or spleen on this study performed without intravenous contrast material.  The stomach, duodenum, pancreas, and adrenal glands are unremarkable.  Tiny layering calcified stones are seen in the fundus of the gallbladder.  Moderate right-sided hydroureteronephrosis is noted.  There is a tiny stones evident in the right renal pelvis.  Mild fullness of the left intrarenal collecting system noted, but not nearly to the degree of the obstruction seen on the right.  There is no abdominal aortic aneurysm.  Dense atherosclerotic calcification is seen in the wall of the abdominal aorta.  No abdominal lymphadenopathy.  Imaging through the pelvis shows a marked amount of stool in the rectum.  The rectal vault measures 10.9 x 10.7 cm there is mild circumferential rectal wall thickening.  Mild perirectal edema is seen in the pelvic floor.  No pelvic sidewall lymphadenopathy.  Bladder is markedly distended. Sagittal views show substantial mass effect by  the rectal stool volume and bladder distention may be related to a component of bladder outlet obstruction given the rectal dilatation.  No evidence for adnexal mass.  There is some trace fluid in the left para colic gutter.  There appears to be diffuse edema within the small bowel mesentery no evidence for an overt small bowel obstruction.  No substantial free fluid in the pelvis.  Surgical clips are noted in the right inguinal region.  Old left inferior pubic ramus fracture  noted. T11 compression fracture results in substantial [name] of vertebral body height.  Fracture lines are still visible.  Retropulsion of bony into the anterior canal results in spinal stenosis with narrowing of the AP diameter of the canal down to about 8 mm.  IMPRESSION: Extremely large stool volume in the rectal vault with marked distention of the rectum.  There is circumferential rectal wall thickening and perirectal edema is noted within the soft tissues of the pelvic floor.  This is associated with marked bladder distention and the right greater than left hydroureteronephrosis. Sagittal images suggest that there is mass effect on the bladder base raising the question of a component of bladder outlet obstruction related to the large stool volume in the rectum.  Compression fracture at T11 results in posterior bony retropulsion into the spinal canal, narrowing the AP diameter of the canal to approximately 8 mm.  Fracture lines are still visible suggesting this is acute to subacute and chronicity.  Cholelithiasis.  Tiny stones visible in the right renal pelvis.  Trace intraperitoneal fluid with possible edema or congestion within the small bowel mesentery.   Original Report Authenticated By: Kennith Center, M.D.    Ct Head Wo Contrast  06/08/2012  *RADIOLOGY REPORT*  Clinical Data: Abdominal pain  CT HEAD WITHOUT CONTRAST  Technique:  Contiguous axial images were obtained from the base of the skull through the vertex without contrast.   Comparison: Head CT 06/04/2012  Findings: No acute intracranial hemorrhage.  No focal mass lesion. No CT evidence of acute infarction.   No midline shift or mass effect.  No hydrocephalus.  Basilar cisterns are patent.  There is an extensive cortical atrophy and concomitant dilatation of the ventricles.  There is periventricular and subcortical white matter hypodensities.  Paranasal sinuses and mastoid air cells are clear.  Orbits are normal.  IMPRESSION:  1.  No acute intracranial findings.  2.  Stable atrophy and microvascular disease.   Original Report Authenticated By: Genevive Bi, M.D.    Dg Chest Portable 1 View  06/08/2012  *RADIOLOGY REPORT*  Clinical Data: Evaluate for impaction  PORTABLE CHEST - 1 VIEW  Comparison: 06/04/2012; 11/27/2010  Findings: Grossly unchanged cardiac silhouette and mediastinal contours.  Lung volumes remain reduced with grossly unchanged bibasilar heterogeneous opacities.  There is persistent mild elevation of the left hemidiaphragm and mild gaseous distension of the splenic flexure of the colon.  No definite pleural effusion or pneumothorax.  Unchanged bones.  IMPRESSION:  1.  No acute cardiopulmonary disease. 2.  Persistently reduced lung volumes with grossly unchanged bibasilar opacities, left greater than right, favored to represent atelectasis.  3.  Mild gaseous distension of the colon within the imaged upper abdomen.   Original Report Authenticated By: Tacey Ruiz, MD     Scheduled Meds: . amoxicillin  500 mg Oral Q12H  . aspirin EC  81 mg Oral q morning - 10a  . cholecalciferol  1,000 Units Oral q morning - 10a  . enoxaparin (LOVENOX) injection  30 mg Subcutaneous Q24H  . escitalopram  5 mg Oral q morning - 10a  . feeding supplement  237 mL Oral BID BM  . levothyroxine  75 mcg Oral QAC breakfast  . pilocarpine  1 drop Both Eyes QID  . polyethylene glycol  17 g Oral Daily  . senna-docusate  2 tablet Oral BID  . sodium chloride  3 mL Intravenous Q12H  .  timolol  1 drop Both Eyes BID  . vitamin B-12  1,000 mcg  Oral Daily   Continuous Infusions: . sodium chloride 100 mL/hr at 06/08/12 2135    Active Problems:   H/O: CVA (cerebrovascular accident)   Hypertension   Blindness and low vision   Dementia   Hypothyroidism   Fall at nursing home   UTI (urinary tract infection)   Hydronephrosis   Constipation   Fecal impaction    Warren Lindahl  [facility] Pager [phone]. If 7PM-7AM, please contact night-coverage at [email or web address], password [facility] 06/10/2012, 10:24 AM  LOS: 2 days

## 2012-06-10 NOTE — Consult Note (Signed)
WOC consult Note Reason for Consult: Consult requested for buttocks.  Pt with protruding sacrum bone with stage 1 wound 1X1cm without open area or drainage.  Pt's daughter states this was there prior to admission. Wound type: Inner gluteal fold and perineum with multiple patchy areas of partial thickness skin loss, red round lesions with red macular-papular rash to edges.  This is NOT a pressure ulcer.  Appearance consistent with moisture associated skin damage (MASD) Pressure Ulcer POA: Yes Daughter at bedside to assess site and discuss plan of care.  Pt did not have a foley prior to admission and wore diapers at SNF, according to the daughter.  She is currently frequently incontinent of liquid stool which has exacerbated the MASD partial thickness skin loss. Dressing procedure/placement/frequency: Barrier cream to protect skin and repel moisture. Diapers are not being used at this time and discussed importance of exposing the affected area to air. Daughter appears to understand the discussion. Please re-consult if further assistance is needed.  Thank-you,  Cammie Mcgee MSN, RN, CWOCN, [place], CNS [phone]

## 2012-06-10 NOTE — Progress Notes (Signed)
Pt placed back in bed and Foley removed. 10cc pulled from port. Will continue to monitor pt closely for urinary occurences.

## 2012-06-10 NOTE — Progress Notes (Signed)
Patient has not voided since Foley removal at 1430.  Bladder scan showed 366cc urine, so performed in-out cath and yielded 350cc.  Will continue to monitor.  Diana [name]

## 2012-06-11 NOTE — Progress Notes (Addendum)
Pt incontinent of urine; PVR > 1000; I&O cath done at this time; urine out; spoke with MD; order to replace foley and keep in place; will cont. To monitor.

## 2012-06-11 NOTE — Progress Notes (Signed)
Foley cath inserted at this time; pt tolerated well; NT assisted with insertion; barrier cream applied to buttucks; sacral area very red and tender; will cont. To monitor.

## 2012-06-11 NOTE — Progress Notes (Signed)
[facility] PROGRESS NOTE  Diana [name] MWU:[id number] DOB: 05/16/1925 DOA: 06/08/2012 PCP: Florentina Jenny, MD  Assessment/Plan: Severe Sepsis on admission - as proven by leukocytosis, hypotension, evidence of infection, multiorgan dysfunction,. Source urinary tract infection. Resolved    Altered mental status - probably toxic metabolic encephalopathy  -  in the setting of dehydration and UTI.  - improved   Acute kidney injury  Likely mixed secondary to dehydration and obstructive uropathy  Placed on IV hydration on admission - urinary output OK and monitor renal function. Foley placed in the ED and will monitor the urine output closely to assure she does not develop postobsrtuctive polyuria. Urine output stabilized by May 9 and the patient's Foley catheter was removed  Atrial fibrillation   New Onset  Continue aspirin. Patient is not a candidate for anticoagulation since she is unstable on her feet and has a high risk for falls  Severe constipation and fecal impaction  Resolved with good bowel regimen with Colace and MiraLax.  Enterococcus UTI  started on rocephin. After culture results available - patient was changed to iv unasyn on May 8. Patient continued to improved and we changed to oral amoxicillin on May 9  H/O: CVA (cerebrovascular accident)  Continue aspirin   Hypertension   Hypothyroidism  Continue Synthroid  Dysphagia  St eval 5/10   Code Status: dnr Family Communication: daughter  Disposition Plan: alf    Consultants:    Procedures:    HPI/Subjective: Up in the recliner without complaints  Objective: Filed Vitals:   06/10/12 0454 06/10/12 1350 06/10/12 1936 06/11/12 0509  BP: 129/78 116/79 131/73 127/65  Pulse: 82 82 99 81  Temp: 98.2 F (36.8 C) 98.2 F (36.8 C) 97.9 F (36.6 C) 98.1 F (36.7 C)  TempSrc: Oral Oral Oral Oral  Resp: 18 18 18 19   Height:      Weight: 63.7 kg (140 lb 6.9 oz)   63.2 kg (139 lb 5.3 oz)  SpO2: 97%  96% 95% 97%    Intake/Output Summary (Last 24 hours) at 06/11/12 0729 Last data filed at 06/10/12 2325  Gross per 24 hour  Intake    100 ml  Output    350 ml  Net   -250 ml   Filed Weights   06/09/12 0446 06/10/12 0454 06/11/12 0509  Weight: 63.6 kg (140 lb 3.4 oz) 63.7 kg (140 lb 6.9 oz) 63.2 kg (139 lb 5.3 oz)    Exam:   General:  Blind, oriented to place today  Cardiovascular: irreg irreg   Respiratory: ctab   Abdomen: distended , soft , nontender  Musculoskeletal: intact    Data Reviewed: Basic Metabolic Panel:  Recent Labs Lab 06/08/12 1351 06/09/12 0457 06/10/12 0515  NA 123* 130* 134*  K 4.5 4.0 4.0  CL 91* 99 104  CO2 18* 17* 18*  GLUCOSE 110* 95 96  BUN 86* 52* 28*  CREATININE 2.14* 1.11* 0.69  CALCIUM 8.5 7.9* 8.3*   Liver Function Tests:  Recent Labs Lab 06/08/12 1351  AST 84*  ALT 42*  ALKPHOS 95  BILITOT 0.5  PROT 6.5  ALBUMIN 2.6*    Recent Labs Lab 06/08/12 1351  LIPASE 13   No results found for this basename: AMMONIA,  in the last 168 hours CBC:  Recent Labs Lab 06/08/12 1351 06/09/12 0457 06/10/12 0515  WBC 15.8* 12.4* 10.5  NEUTROABS 14.2*  --   --   HGB 10.7* 10.0* 11.4*  HCT 29.1* 28.2* 33.8*  MCV  85.1 87.0 91.8  PLT 329 343 309   Cardiac Enzymes:  Recent Labs Lab 06/08/12 1351  TROPONINI <0.30   BNP (last 3 results) No results found for this basename: PROBNP,  in the last 8760 hours CBG: No results found for this basename: GLUCAP,  in the last 168 hours  Recent Results (from the past 240 hour(s))  URINE CULTURE     Status: None   Collection Time    06/08/12  3:16 PM      Result Value Range Status   Specimen Description URINE, CATHETERIZED   Final   Special Requests NONE   Final   Culture  Setup Time 06/08/2012 16:58   Final   Colony Count >=100,000 COLONIES/ML   Final   Culture ENTEROCOCCUS SPECIES   Final   Report Status 06/10/2012 FINAL   Final   Organism ID, Bacteria ENTEROCOCCUS SPECIES    Final  MRSA PCR SCREENING     Status: None   Collection Time    06/08/12 10:02 PM      Result Value Range Status   MRSA by PCR NEGATIVE  NEGATIVE Final   Comment:            The GeneXpert MRSA Assay (FDA     approved for NASAL specimens     only), is one component of a     comprehensive MRSA colonization     surveillance program. It is not     intended to diagnose MRSA     infection nor to guide or     monitor treatment for     MRSA infections.     Studies: No results found.  Scheduled Meds: . amoxicillin  500 mg Oral Q12H  . aspirin EC  81 mg Oral q morning - 10a  . cholecalciferol  1,000 Units Oral q morning - 10a  . enoxaparin (LOVENOX) injection  30 mg Subcutaneous Q24H  . escitalopram  5 mg Oral q morning - 10a  . feeding supplement  237 mL Oral BID BM  . levothyroxine  75 mcg Oral QAC breakfast  . pilocarpine  1 drop Both Eyes QID  . polyethylene glycol  17 g Oral Daily  . senna-docusate  2 tablet Oral BID  . sodium chloride  3 mL Intravenous Q12H  . timolol  1 drop Both Eyes BID  . vitamin B-12  1,000 mcg Oral Daily   Continuous Infusions: . sodium chloride 50 mL/hr at 06/10/12 1835    Principal Problem:   Sepsis Active Problems:   H/O: CVA (cerebrovascular accident)   Hypertension   Blindness and low vision   Dementia   Hypothyroidism   Fall at nursing home   UTI (urinary tract infection)   Hydronephrosis   Constipation   Fecal impaction    Cru Kritikos  [facility] Pager [phone]. If 7PM-7AM, please contact night-coverage at [email or web address], password [facility] 06/11/2012, 7:29 AM  LOS: 3 days

## 2012-06-11 NOTE — Progress Notes (Signed)
RN spoke with pt's daughter; daughter requesting Xanax or Valium PRN for pt; pt appears calm at this time; per daughter pt was agitated while talking on phone with her; MD paged to make aware; will give pt schedule Lexapro; will cont. To monitor.

## 2012-06-11 NOTE — Evaluation (Signed)
Clinical/Bedside Swallow Evaluation Patient Details  Name: Diana [name] MRN: [id number] Date of Birth: 1925-12-22  Today's Date: 06/11/2012 Time: 0915-0950 SLP Time Calculation (min): 35 min  Past Medical History:  Past Medical History  Diagnosis Date  . Thyroid disease   . Hypertension   . Blind   . Anxiety   . Depression   . CVA (cerebral vascular accident)    Past Surgical History:  Past Surgical History  Procedure Laterality Date  . Appendectomy    . Abdominal hysterectomy    . Eye surgery     HPI:  Pt is an 79 year old female admitted from SNF with baseline dementia with AMS, likely from UTI and bowel impaction. Pt observed to cough/choke with meals. Made NPO. Discussed with dtr who reprots pt had a healthy appetite prior to admit to hospital. 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 On admission lungs were clear.    Assessment / Plan / Recommendation Clinical Impression  Pt presents with minimal evidence for a pharyngeal dysphagia. Prior to this admission, pt was eating very well despite a history of baseline oral dyspahgia without any evidence of pna. Today swallows small sips of thin liquids without signs of aspiration, but grimaces and also refuses solids. Suspect pt experiencing a change in digestive function due to acute medical needs. At this time would recommend pt continue to consume a full liquid diet without any additional testing. SLP will f/u on Monday and reassess pts function. If she continues to struggle with solids, may pursue testing/referrals. MD and family in agreement.     Aspiration Risk  Mild    Diet Recommendation Thin liquid   Liquid Administration via: Cup;Straw Medication Administration: Whole meds with liquid Supervision: Staff feed patient (pt is blind) Compensations: Slow rate;Small sips/bites Postural  Changes and/or Swallow Maneuvers: Seated upright 90 degrees    Other  Recommendations Oral Care Recommendations: Oral care BID   Follow Up Recommendations  Other (comment) ([facility])    Frequency and Duration min 2x/week  2 weeks   Pertinent Vitals/Pain NA    SLP Swallow Goals Patient will utilize recommended strategies during swallow to increase swallowing safety with: Moderate assistance Swallow Study Goal #2 - Progress: Progressing toward goal   Swallow Study Prior Functional Status       General HPI: Pt is an 79 year old female admitted from SNF with baseline dementia with AMS, likely from UTI and bowel impaction. Pt observed to cough/choke with meals. Made NPO. Discussed with dtr who reprots pt had a healthy appetite prior to admit to hospital. 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 On admission lungs were clear.  Type of Study: Bedside swallow evaluation Previous Swallow Assessment: none in echart Diet Prior to this Study: NPO Temperature Spikes Noted: No Respiratory Status: Room air History of Recent Intubation: No Behavior/Cognition: Alert;Cooperative Oral Cavity - Dentition: Poor condition;Missing dentition Self-Feeding Abilities: Needs assist Patient Positioning: Upright in bed Baseline Vocal Quality: Clear Volitional Cough: Strong Volitional Swallow: Able to elicit    Oral/Motor/Sensory Function Overall Oral Motor/Sensory Function: Impaired at baseline Labial ROM: Within Functional Limits Labial Symmetry: Within Functional Limits Labial Strength: Within Functional Limits Labial Sensation: Within Functional Limits Lingual ROM: Reduced right;Reduced left Lingual Symmetry: Abnormal symmetry right;Abnormal symmetry left Lingual  Strength: Reduced Lingual Sensation: Within Functional Limits Facial ROM: Within Functional  Limits Facial Symmetry: Within Functional Limits Facial Strength: Within Functional Limits Facial Sensation: Within Functional Limits Velum: Within Functional Limits Mandible: Other (Comment) (partial mandibular resection with reconstruction)   Ice Chips Ice chips: Within functional limits   Thin Liquid Thin Liquid: Impaired Presentation: Straw;Cup Oral Phase Impairments: Reduced labial seal;Impaired anterior to posterior transit;Reduced lingual movement/coordination Oral Phase Functional Implications: Right anterior spillage;Prolonged oral transit;Oral residue;Oral holding Pharyngeal  Phase Impairments: Cough - Immediate    Nectar Thick Nectar Thick Liquid: Not tested   Honey Thick Honey Thick Liquid: Not tested   Puree Puree: Impaired Presentation: Spoon Oral Phase Impairments: Reduced labial seal;Reduced lingual movement/coordination;Impaired anterior to posterior transit Oral Phase Functional Implications: Prolonged oral transit Pharyngeal Phase Impairments:  (grimace, refusal)   Solid   GO    Solid: Impaired Oral Phase Impairments: Reduced labial seal;Reduced lingual movement/coordination;Impaired anterior to posterior transit Oral Phase Functional Implications: Other (comment) (required liquid bolus to transit)       Makayela Secrest, [employer] 06/11/2012,10:26 AM

## 2012-06-12 ENCOUNTER — Inpatient Hospital Stay (HOSPITAL_COMMUNITY): Payer: Medicare Other

## 2012-06-12 MED ORDER — POLYETHYLENE GLYCOL 3350 17 G PO PACK
17.0000 g | PACK | Freq: Two times a day (BID) | ORAL | Status: DC
  Administered 2012-06-12 – 2012-06-14 (×4): 17 g via ORAL
  Filled 2012-06-12 (×6): qty 1

## 2012-06-12 NOTE — Progress Notes (Signed)
Son-in-law at bedside attempting to encourage pt to eat lunch; IVF d/c; will cont. To encourage oral fluids and food; will cont. To monitor.

## 2012-06-12 NOTE — Progress Notes (Signed)
[facility] PROGRESS NOTE  Diana [name] BJY:[id number] DOB: 09/27/1925 DOA: 06/08/2012 PCP: Diana Jenny, MD  Assessment/Plan: Severe Sepsis on admission - as proven by leukocytosis, hypotension, evidence of infection, multiorgan dysfunction,. Source urinary tract infection. Resolved    Altered mental status - probably toxic metabolic encephalopathy  -  in the setting of dehydration and UTI.  - improved   Acute kidney injury  Likely mixed secondary to dehydration and obstructive uropathy  Placed on IV hydration on admission - urinary output OK and monitor renal function. Foley placed in the ED and will monitor the urine output closely to assure she does not develop postobsrtuctive polyuria. Urine output stabilized by May 9 and the patient's Foley catheter was removed. She was unable to void and foley was reinserted on 5/10  Atrial fibrillation   New Onset  Continue aspirin. Patient is not a candidate for anticoagulation since she is unstable on her feet and has a high risk for falls  Severe constipation and fecal impaction  Resolved with good bowel regimen with Colace and MiraLax.  Enterococcus UTI  started on rocephin. After culture results available - patient was changed to iv unasyn on May 8. Patient continued to improved and we changed to oral amoxicillin on May 9  H/O: CVA (cerebrovascular accident)  Continue aspirin   Hypertension   Hypothyroidism  Continue Synthroid  Dysphagia  St eval 5/10 - patient on full liquid diet   Severe protein caloric malnutrition with very little po intake- at risk for dehydration . Stop iv fluids 5/11 to see if she is able to eat/drink enough without getting dehydrated  Code Status: dnr Family Communication: daughter  Disposition Plan: alf    Consultants:    Procedures:    HPI/Subjective: No active complains   Objective: Filed Vitals:   06/11/12 1354 06/11/12 2021 06/12/12 0434 06/12/12 0729  BP: 133/82  107/68 151/82 132/86  Pulse: 66 67 75   Temp: 97.9 F (36.6 C) 97.6 F (36.4 C) 98.9 F (37.2 C)   TempSrc: Axillary Oral Oral   Resp: 18 18 17    Height:      Weight:   64.3 kg (141 lb 12.1 oz)   SpO2: 100% 95% 94%     Intake/Output Summary (Last 24 hours) at 06/12/12 1107 Last data filed at 06/12/12 1012  Gross per 24 hour  Intake 826.33 ml  Output   1850 ml  Net -1023.67 ml   Filed Weights   06/10/12 0454 06/11/12 0509 06/12/12 0434  Weight: 63.7 kg (140 lb 6.9 oz) 63.2 kg (139 lb 5.3 oz) 64.3 kg (141 lb 12.1 oz)    Exam:   General:  Blind, oriented to place today  Cardiovascular: irreg irreg   Respiratory: ctab   Abdomen: distended , soft , nontender  Musculoskeletal: intact    Data Reviewed: Basic Metabolic Panel:  Recent Labs Lab 06/08/12 1351 06/09/12 0457 06/10/12 0515  NA 123* 130* 134*  K 4.5 4.0 4.0  CL 91* 99 104  CO2 18* 17* 18*  GLUCOSE 110* 95 96  BUN 86* 52* 28*  CREATININE 2.14* 1.11* 0.69  CALCIUM 8.5 7.9* 8.3*   Liver Function Tests:  Recent Labs Lab 06/08/12 1351  AST 84*  ALT 42*  ALKPHOS 95  BILITOT 0.5  PROT 6.5  ALBUMIN 2.6*    Recent Labs Lab 06/08/12 1351  LIPASE 13   No results found for this basename: AMMONIA,  in the last 168 hours CBC:  Recent Labs Lab  06/08/12 1351 06/09/12 0457 06/10/12 0515  WBC 15.8* 12.4* 10.5  NEUTROABS 14.2*  --   --   HGB 10.7* 10.0* 11.4*  HCT 29.1* 28.2* 33.8*  MCV 85.1 87.0 91.8  PLT 329 343 309   Cardiac Enzymes:  Recent Labs Lab 06/08/12 1351  TROPONINI <0.30   BNP (last 3 results) No results found for this basename: PROBNP,  in the last 8760 hours CBG: No results found for this basename: GLUCAP,  in the last 168 hours  Recent Results (from the past 240 hour(s))  URINE CULTURE     Status: None   Collection Time    06/08/12  3:16 PM      Result Value Range Status   Specimen Description URINE, CATHETERIZED   Final   Special Requests NONE   Final   Culture   Setup Time 06/08/2012 16:58   Final   Colony Count >=100,000 COLONIES/ML   Final   Culture ENTEROCOCCUS SPECIES   Final   Report Status 06/10/2012 FINAL   Final   Organism ID, Bacteria ENTEROCOCCUS SPECIES   Final  MRSA PCR SCREENING     Status: None   Collection Time    06/08/12 10:02 PM      Result Value Range Status   MRSA by PCR NEGATIVE  NEGATIVE Final   Comment:            The GeneXpert MRSA Assay (FDA     approved for NASAL specimens     only), is one component of a     comprehensive MRSA colonization     surveillance program. It is not     intended to diagnose MRSA     infection nor to guide or     monitor treatment for     MRSA infections.     Studies: No results found.  Scheduled Meds: . amoxicillin  500 mg Oral Q12H  . aspirin EC  81 mg Oral q morning - 10a  . cholecalciferol  1,000 Units Oral q morning - 10a  . enoxaparin (LOVENOX) injection  30 mg Subcutaneous Q24H  . escitalopram  5 mg Oral q morning - 10a  . feeding supplement  237 mL Oral BID BM  . levothyroxine  75 mcg Oral QAC breakfast  . pilocarpine  1 drop Both Eyes QID  . polyethylene glycol  17 g Oral BID  . senna-docusate  2 tablet Oral BID  . sodium chloride  3 mL Intravenous Q12H  . timolol  1 drop Both Eyes BID  . vitamin B-12  1,000 mcg Oral Daily   Continuous Infusions: . sodium chloride 50 mL/hr at 06/10/12 1835    Principal Problem:   Sepsis Active Problems:   H/O: CVA (cerebrovascular accident)   Hypertension   Blindness and low vision   Dementia   Hypothyroidism   Fall at nursing home   UTI (urinary tract infection)   Hydronephrosis   Constipation   Fecal impaction    Diana [name]  [facility] Pager [phone]. If 7PM-7AM, please contact night-coverage at [email or web address], password [facility] 06/12/2012, 11:07 AM  LOS: 4 days

## 2012-06-13 ENCOUNTER — Inpatient Hospital Stay (HOSPITAL_COMMUNITY): Payer: Medicare Other

## 2012-06-13 DIAGNOSIS — R5381 Other malaise: Secondary | ICD-10-CM

## 2012-06-13 LAB — BASIC METABOLIC PANEL
BUN: 9 mg/dL (ref 6–23)
CO2: 23 mEq/L (ref 19–32)
Calcium: 7.8 mg/dL — ABNORMAL LOW (ref 8.4–10.5)
Chloride: 98 mEq/L (ref 96–112)
Creatinine, Ser: 0.54 mg/dL (ref 0.50–1.10)

## 2012-06-13 MED ORDER — SORBITOL 70 % SOLN
960.0000 mL | TOPICAL_OIL | Freq: Once | ORAL | Status: AC
  Administered 2012-06-13: 960 mL via RECTAL
  Filled 2012-06-13: qty 240

## 2012-06-13 MED ORDER — LEVOTHYROXINE SODIUM 100 MCG PO TABS
100.0000 ug | ORAL_TABLET | Freq: Every day | ORAL | Status: DC
  Filled 2012-06-13 (×2): qty 1

## 2012-06-13 NOTE — Consult Note (Signed)
Patient Diana [name]      DOB: December 10, 1925      VWU:[id number]     Consult Note from the Palliative Medicine Team at [facility]    Consult Requested by: Dr Lavera Guise     PCP: Florentina Jenny, MD Reason for Consultation:Clarification of GOC and options     Phone Number:[phone]  Assessment of patients Current state:  Continued physical and functional and cognitive decline over the past year.  Per daughter, poor po intake, frequent falls, urinary and fecal incontinence.  PMH dementia, HTN, blindness, CVA lives in [place].  Workup for hyponatremia, leukocytosis, elevated creatine, fecal impaction, admitted.  Stabilized, conversation now to determine clarification of GOC and options  Consult is for review of medical treatment options, clarification of goals of care and end of life issues, disposition and options, and symptom recommendation.  This NP Lorinda Creed reviewed medical records, received report from team, assessed the patient and then meet at the patient's bedside along with her daughter Harriet Pho  h# [phone], C# [phone]  to discuss diagnosis prognosis, GOC, EOL wishes disposition and options.   A detailed discussion was had today regarding advanced directives.  Concepts specific to code status, artifical feeding and hydration, continued IV antibiotics and rehospitalization was had.  The difference between a aggressive medical intervention path  and a palliative comfort care path for this patient at this time was had.  Values and goals of care important to patient and family were attempted to be elicited.  Concept of Hospice and Palliative Care were discussed  Natural trajectory and expectations at EOL were discussed.  Questions and concerns addressed.  Hard Choices booklet left for review. Family encouraged to call with questions or concerns.  PMT will continue to support holistically.    Goals of Care: 1.  Code Status:DNR/DNI   2. Scope of Treatment: 1. Vital Signs:  daily  2. Respiratory/Oxygen: for comfort only 3. Nutritional Support/Tube Feeds:no artificial feeding now or in the future 4. Antibiotics:none 5. Blood Products:none 6. LKG:MWNU 7. Review of Medications to be discontinued:minimize for comfort 8. Labs: none 9. Telemetry:none 10. Consults:none   4. Disposition: Disposition back to the AL, [facility], hopeful for hospice services if eligible.  Will write for choice    3. Symptom Management:   1. Pain: Tylenol 650 mg every 6 hrs prn 2.  Weakness/Failure to Thrive: focus of care is comfort, all focus is on enhancing quality of life  4. Psychosocial:  Emotional support offered to daughter, she is the only child and this is a difficult time for her.    Patient Documents Completed or Given: Document Given Completed  Advanced Directives Pkt    MOST  yes  DNR    Gone from [facility]    Hard Choices      Brief HPI:  Continued physical and functional and cognitive decline over the past year.  Per daughter, poor po intake, frequent falls, urinary and fecal incontinence.  PMH dementia, HTN, blindness, CVA lives in [place].  Workup for hyponatremia, leukocytosis, elevated creatine, fecal impaction, admitted.  Stabilized, conversation now to determine clarification of GOC and options     ROS: patient oriented to person only, poor  Historian, unable to illicit    PMH:  Past Medical History  Diagnosis Date  . Thyroid disease   . Hypertension   . Blind   . Anxiety   . Depression   . CVA (cerebral vascular accident)      PSH: Past Surgical  History  Procedure Laterality Date  . Appendectomy    . Abdominal hysterectomy    . Eye surgery     I have reviewed the FH and SH and  If appropriate update it with new information. Allergies  Allergen Reactions  . Codeine Other (See Comments)    Per MAR  . Latex Other (See Comments)    Per MAR  . Macrolides And Ketolides Other (See Comments)    Per MAR  . Nitrofurantoin Other (See  Comments)    Per MAR  . Oxycodone Other (See Comments)    Per MAR  . Sulfa Antibiotics Other (See Comments)    Per MAR  . Trimethoprim Other (See Comments)    Per MAR   Scheduled Meds: . amoxicillin  500 mg Oral Q12H  . aspirin EC  81 mg Oral q morning - 10a  . cholecalciferol  1,000 Units Oral q morning - 10a  . enoxaparin (LOVENOX) injection  30 mg Subcutaneous Q24H  . escitalopram  5 mg Oral q morning - 10a  . feeding supplement  237 mL Oral BID BM  . [START ON 06/14/2012] levothyroxine  100 mcg Oral QAC breakfast  . pilocarpine  1 drop Both Eyes QID  . polyethylene glycol  17 g Oral BID  . senna-docusate  2 tablet Oral BID  . sodium chloride  3 mL Intravenous Q12H  . timolol  1 drop Both Eyes BID  . vitamin B-12  1,000 mcg Oral Daily   Continuous Infusions:  PRN Meds:.acetaminophen, acetaminophen, sodium phosphate    BP 123/78  Pulse 77  Temp(Src) 98 F (36.7 C) (Oral)  Resp 18  Ht 5\' 9"  (1.753 m)  Wt 62.8 kg (138 lb 7.2 oz)  BMI 20.44 kg/m2  SpO2 96%   PPS:30 % at best   Intake/Output Summary (Last 24 hours) at 06/13/12 1610 Last data filed at 06/13/12 0700  Gross per 24 hour  Intake    240 ml  Output   1250 ml  Net  -1010 ml   LBM:06-13-12                      Physical Exam:  General: Chronically ill appearing, NAD,  HEENT:  Mm, no exudate Chest:   Diminished in bases, CTA CVS: irregular Abdomen:soft NT +BS Ext: without edema, Neuro:oriented only to person  Labs: CBC    Component Value Date/Time   WBC 10.5 06/10/2012 0515   RBC 3.68* 06/10/2012 0515   HGB 11.4* 06/10/2012 0515   HCT 33.8* 06/10/2012 0515   PLT 309 06/10/2012 0515   MCV 91.8 06/10/2012 0515   MCH 31.0 06/10/2012 0515   MCHC 33.7 06/10/2012 0515   RDW 13.7 06/10/2012 0515   LYMPHSABS 0.5* 06/08/2012 1351   MONOABS 1.1* 06/08/2012 1351   EOSABS 0.0 06/08/2012 1351   BASOSABS 0.0 06/08/2012 1351    BMET    Component Value Date/Time   NA 129* 06/13/2012 0410   K 3.5 06/13/2012 0410   CL 98  06/13/2012 0410   CO2 23 06/13/2012 0410   GLUCOSE 98 06/13/2012 0410   BUN 9 06/13/2012 0410   CREATININE 0.54 06/13/2012 0410   CALCIUM 7.8* 06/13/2012 0410   GFRNONAA 84* 06/13/2012 0410   GFRAA >90 06/13/2012 0410    CMP     Component Value Date/Time   NA 129* 06/13/2012 0410   K 3.5 06/13/2012 0410   CL 98 06/13/2012 0410   CO2 23 06/13/2012 0410  GLUCOSE 98 06/13/2012 0410   BUN 9 06/13/2012 0410   CREATININE 0.54 06/13/2012 0410   CALCIUM 7.8* 06/13/2012 0410   PROT 6.5 06/08/2012 1351   ALBUMIN 2.6* 06/08/2012 1351   AST 84* 06/08/2012 1351   ALT 42* 06/08/2012 1351   ALKPHOS 95 06/08/2012 1351   BILITOT 0.5 06/08/2012 1351   GFRNONAA 84* 06/13/2012 0410   GFRAA >90 06/13/2012 0410      Time In Time Out Total Time Spent with Patient Total Overall Time  1600 1750 80 min 90 min    Greater than 50%  of this time was spent counseling and coordinating care related to the above assessment and plan.  Lorinda Creed NP  Palliative Medicine Team Team Phone # [phone] Pager [phone]  Discussed with Dr Lavera Guise

## 2012-06-13 NOTE — Progress Notes (Signed)
Physical Therapy Treatment Patient Details Name: Diana [name] MRN: [id number] DOB: 1925-11-09 Today's Date: 06/13/2012 Time: 4098-1191 PT Time Calculation (min): 25 min  PT Assessment / Plan / Recommendation Comments on Treatment Session  Pt received in bedside chair with daughter present during session. Pt continues to demonstrate deficits in functional mobility requiring assist for activities. Patient performed multiple trials of standing using rw. Upon standing, pt became incontinent of stool. Assisted Pt with hygiene.  After multiple trials of standing, patient was too fatigued to perform ambulation. Will continue to see and progress activity as tolerated.    Follow Up Recommendations  SNF     Does the patient have the potential to tolerate intense rehabilitation     Barriers to Discharge        Equipment Recommendations  None recommended by PT    Recommendations for Other Services    Frequency Min 2X/week   Plan Discharge plan remains appropriate    Precautions / Restrictions Restrictions Weight Bearing Restrictions: No   Pertinent Vitals/Pain No pain reported at this time    Mobility  Bed Mobility Bed Mobility: Not assessed Details for Bed Mobility Assistance: pt received in chair Transfers Transfers: Sit to Stand;Stand to Sit Sit to Stand: 1: +2 Total assist Sit to Stand: Patient Percentage: 60% Stand to Sit: 1: +2 Total assist Stand to Sit: Patient Percentage: 60% Details for Transfer Assistance: Assist to elevate to standing with use of RW for UE support    Exercises General Exercises - Lower Extremity Ankle Circles/Pumps: AROM;Both;10 reps Long Arc Quad: AROM;Both;10 reps Hip Flexion/Marching: AROM;Both;10 reps     PT Goals Acute Rehab PT Goals PT Goal Formulation: With family Time For Goal Achievement: 06/24/12 Potential to Achieve Goals: Fair Pt will Transfer Bed to Chair/Chair to Bed: with min assist PT Transfer Goal: Bed to Chair/Chair to Bed  - Progress: Progressing toward goal Pt will Stand: with min assist PT Goal: Stand - Progress: Progressing toward goal Pt will Ambulate: 51 - 150 feet;with min assist;with rolling walker PT Goal: Ambulate - Progress: Not progressing  Visit Information  Last PT Received On: 06/13/12 Assistance Needed: +2    Subjective Data  Subjective: I cant walk Patient Stated Goal: to go back to [facility]   Cognition  Cognition Arousal/Alertness: Awake/alert Behavior During Therapy: WFL for tasks assessed/performed Overall Cognitive Status: History of cognitive impairments - at baseline    Balance  Balance Balance Assessed: Yes Static Sitting Balance Static Sitting - Balance Support: Bilateral upper extremity supported;Feet supported Static Sitting - Level of Assistance: 4: Min assist Static Sitting - Comment/# of Minutes: 9 minutes during ther ex in chair with posterior support Static Standing Balance Static Standing - Balance Support: Bilateral upper extremity supported Static Standing - Level of Assistance: 3: Mod assist Static Standing - Comment/# of Minutes: 2 minutes, 1 minutes, 1 minute, 2 minutes  End of Session PT - End of Session Equipment Utilized During Treatment: Gait belt Activity Tolerance: Patient limited by fatigue Patient left: in chair;with call bell/phone within reach;with family/visitor present Nurse Communication: Mobility status   GP     Fabio Asa 06/13/2012, 2:42 PM Charlotte Crumb, PT DPT  [phone]

## 2012-06-13 NOTE — Clinical Social Work Note (Signed)
Clinical Social Worker continuing to follow patient and family for support and to assist with patient discharge needs.  Patient is currently from [employer] with her husband and per patient daughter she would like for her to return at discharge.  CSW spoke with patient daughter over the phone today who has requested Hospice involvement.  CM made MD aware of patient daughter request.  CSW spoke with Palliative Medicine who plan to address patient needs with patient daughter.  CSW remains available for support and to facilitate patient discharge back to [facility] vs. Possible residential Hospice pending palliative meeting.    Macario Golds, [place] [phone]

## 2012-06-13 NOTE — Progress Notes (Signed)
Speech Language Pathology Dysphagia Treatment Patient Details Name: Diana [name] MRN: [id number] DOB: [birth year] Today's Date: 06/13/2012 Time: 7829-5621 SLP Time Calculation (min): 35 min  Assessment / Plan / Recommendation Clinical Impression  Pt. took one small bite of soft peach and swallowed after prolonged mastication.  Pt. stated "I don't want anymore of that.  I can't chew.  I don't have teeth."  When offered applesauce or pudding, pt. refused, stating "I told you I can't eat!"  Pt. did take one small bite of magic cup, without overt difficulty noted moving the bolus through the oral cavity, and took sip of water from straw without overt s/s of aspiration.  Pt. refused further po's, including a thin tomato soup, stating she can not chew and can not digest food.  Apparrently, pt. was eating soft solids well prior to admission.  ? if there is a GI issue, or just reduced appetite with dementia.  Note that family is requesting hospice.    Diet Recommendation  Continue with Current Diet:  (Full liquid)    SLP Plan Continue with current plan of care   Pertinent Vitals/Pain n/a   Swallowing Goals  SLP Swallowing Goals Patient will consume recommended diet without observed clinical signs of aspiration with: Total assistance Swallow Study Goal #1 - Progress: Partly Met Swallow Study Goal #2 - Progress: Partly met  General Temperature Spikes Noted: No Respiratory Status: Room air Behavior/Cognition: Alert;Confused;Distractible;Requires cueing;Doesn't follow directions;Decreased sustained attention Oral Cavity - Dentition: Poor condition;Missing dentition Patient Positioning: Upright in chair  Oral Cavity - Oral Hygiene Does patient have any of the following "at risk" factors?: Lips - dry, cracked;Nutritional status - inadequate;Nutritional status - dependent feeder Brush patient's teeth BID with toothbrush (using toothpaste with fluoride): Yes Patient is HIGH RISK - Oral Care  Protocol followed (see row info): Yes Patient is AT RISK - Oral Care Protocol followed (see row info): Yes   Dysphagia Treatment Treatment focused on: Skilled observation of diet tolerance;Upgraded PO texture trials Treatment Methods/Modalities: Skilled observation Patient observed directly with PO's: Yes Type of PO's observed: Dysphagia 1 (puree);Dysphagia 2 (chopped);Thin liquids Feeding: Total assist;Refused PO Liquids provided via: Cup;Straw Oral Phase Signs & Symptoms: Prolonged mastication;Prolonged oral phase Type of cueing: Verbal;Tactile Amount of cueing: Maximal   GO     Diana [name] T 06/13/2012, 5:07 PM

## 2012-06-13 NOTE — Progress Notes (Signed)
[facility] PROGRESS NOTE  Diana [name] ZOX:[id number] DOB: 1925/08/01 DOA: 06/08/2012 PCP: Florentina Jenny, MD  Assessment/Plan: Severe Sepsis on admission - as proven by leukocytosis, hypotension, evidence of infection, multiorgan dysfunction,. Source urinary tract infection. Resolved    Altered mental status - probably toxic metabolic encephalopathy  -  in the setting of dehydration and UTI.  - improved   Acute kidney injury  Likely mixed secondary to dehydration and obstructive uropathy  Placed on IV hydration on admission - urinary output OK and monitor renal function. Foley placed in the ED and will monitor the urine output closely to assure she does not develop postobsrtuctive polyuria. Urine output stabilized by May 9 and the patient's Foley catheter was removed. She was unable to void and foley was reinserted on 5/10 Remove foley again 06/13/12    Atrial fibrillation   New Onset  Continue aspirin. Patient is not a candidate for anticoagulation since she is unstable on her feet and has a high risk for falls  Severe constipation and fecal impaction  Appeared Resolved with good bowel regimen with Colace and MiraLax initially - then patient retained urine again - AXR 06/12/12 still with fecal impaction - SMOG enema ordered 06/13/12  Repeat AXR 06/13/12 - resolution of fecal impaction -   Enterococcus UTI  started on rocephin. After culture results available - patient was changed to iv unasyn on May 8. Patient continued to improved and we changed to oral amoxicillin on May 9  H/O: CVA (cerebrovascular accident)  Continue aspirin   Hypertension   Hypothyroidism  Continue Synthroid  Dysphagia  St eval 5/10 - patient on full liquid diet   Severe protein caloric malnutrition with very little po intake- at risk for dehydration . Stop iv fluids 5/11 to see if she is able to eat/drink enough without getting dehydrated  Code Status: dnr Family Communication: daughter   Disposition Plan: alf    Consultants:  Palliative care   Procedures:    HPI/Subjective: No active complains   Objective: Filed Vitals:   06/12/12 0729 06/12/12 1402 06/12/12 1931 06/13/12 0442  BP: 132/86 100/55 136/81 154/84  Pulse:  83 76 74  Temp:  98.9 F (37.2 C) 99.4 F (37.4 C) 98.3 F (36.8 C)  TempSrc:  Oral Oral Oral  Resp:  18 16 18   Height:      Weight:    62.8 kg (138 lb 7.2 oz)  SpO2:  98% 92% 97%    Intake/Output Summary (Last 24 hours) at 06/13/12 0839 Last data filed at 06/13/12 0300  Gross per 24 hour  Intake 660.83 ml  Output   1550 ml  Net -889.17 ml   Filed Weights   06/11/12 0509 06/12/12 0434 06/13/12 0442  Weight: 63.2 kg (139 lb 5.3 oz) 64.3 kg (141 lb 12.1 oz) 62.8 kg (138 lb 7.2 oz)    Exam:   General:  Blind, oriented to place today  Cardiovascular: irreg irreg   Respiratory: ctab   Abdomen: distended , soft , nontender  Musculoskeletal: intact    Data Reviewed: Basic Metabolic Panel:  Recent Labs Lab 06/08/12 1351 06/09/12 0457 06/10/12 0515 06/13/12 0410  NA 123* 130* 134* 129*  K 4.5 4.0 4.0 3.5  CL 91* 99 104 98  CO2 18* 17* 18* 23  GLUCOSE 110* 95 96 98  BUN 86* 52* 28* 9  CREATININE 2.14* 1.11* 0.69 0.54  CALCIUM 8.5 7.9* 8.3* 7.8*   Liver Function Tests:  Recent Labs Lab 06/08/12 1351  AST 84*  ALT 42*  ALKPHOS 95  BILITOT 0.5  PROT 6.5  ALBUMIN 2.6*    Recent Labs Lab 06/08/12 1351  LIPASE 13   No results found for this basename: AMMONIA,  in the last 168 hours CBC:  Recent Labs Lab 06/08/12 1351 06/09/12 0457 06/10/12 0515  WBC 15.8* 12.4* 10.5  NEUTROABS 14.2*  --   --   HGB 10.7* 10.0* 11.4*  HCT 29.1* 28.2* 33.8*  MCV 85.1 87.0 91.8  PLT 329 343 309   Cardiac Enzymes:  Recent Labs Lab 06/08/12 1351  TROPONINI <0.30   BNP (last 3 results) No results found for this basename: PROBNP,  in the last 8760 hours CBG: No results found for this basename: GLUCAP,  in the  last 168 hours  Recent Results (from the past 240 hour(s))  URINE CULTURE     Status: None   Collection Time    06/08/12  3:16 PM      Result Value Range Status   Specimen Description URINE, CATHETERIZED   Final   Special Requests NONE   Final   Culture  Setup Time 06/08/2012 16:58   Final   Colony Count >=100,000 COLONIES/ML   Final   Culture ENTEROCOCCUS SPECIES   Final   Report Status 06/10/2012 FINAL   Final   Organism ID, Bacteria ENTEROCOCCUS SPECIES   Final  MRSA PCR SCREENING     Status: None   Collection Time    06/08/12 10:02 PM      Result Value Range Status   MRSA by PCR NEGATIVE  NEGATIVE Final   Comment:            The GeneXpert MRSA Assay (FDA     approved for NASAL specimens     only), is one component of a     comprehensive MRSA colonization     surveillance program. It is not     intended to diagnose MRSA     infection nor to guide or     monitor treatment for     MRSA infections.     Studies: Dg Abd 1 View  06/12/2012  *RADIOLOGY REPORT*  Clinical Data: Constipation.  Urinary retention.  ABDOMEN - 1 VIEW  Comparison: CT 06/08/2012.  Findings: The right lateral abdomen is excluded from view on this single frontal radiograph.  There is no gross plain film evidence of free air.  Surgical clips are present over the right hip, possibly related to vascular surgery or herniorrhaphy. Atherosclerosis.  Lumbar spondylosis.  The bowel gas pattern is nonobstructive.  There are no dilated loops of large or small bowel.  Stool burden appears within normal limits.  Stool and bowel gas are present within the rectosigmoid.  IMPRESSION: Visualized bowel gas pattern within normal limits.   Original Report Authenticated By: Andreas Newport, M.D.     Scheduled Meds: . amoxicillin  500 mg Oral Q12H  . aspirin EC  81 mg Oral q morning - 10a  . cholecalciferol  1,000 Units Oral q morning - 10a  . enoxaparin (LOVENOX) injection  30 mg Subcutaneous Q24H  . escitalopram  5 mg Oral q  morning - 10a  . feeding supplement  237 mL Oral BID BM  . [START ON 06/14/2012] levothyroxine  100 mcg Oral QAC breakfast  . pilocarpine  1 drop Both Eyes QID  . polyethylene glycol  17 g Oral BID  . senna-docusate  2 tablet Oral BID  . sodium chloride  3 mL Intravenous  Q12H  . sorbitol, milk of mag, mineral oil, glycerin (SMOG) enema  960 mL Rectal Once  . timolol  1 drop Both Eyes BID  . vitamin B-12  1,000 mcg Oral Daily   Continuous Infusions:    Principal Problem:   Sepsis Active Problems:   H/O: CVA (cerebrovascular accident)   Hypertension   Blindness and low vision   Dementia   Hypothyroidism   Fall at nursing home   UTI (urinary tract infection)   Hydronephrosis   Constipation   Fecal impaction    Diana [name]  [facility] Pager [phone]. If 7PM-7AM, please contact night-coverage at [email or web address], password [facility] 06/13/2012, 8:39 AM  LOS: 5 days

## 2012-06-13 NOTE — Progress Notes (Signed)
Thank you for consulting the Palliative Medicine Team at [facility] to meet your patient's and family's needs.   The reason that you asked [place] to see your patient is for hospice care consult  We have scheduled your patient for a meeting: today, Tuesday 06/13/12 @ 4-4:15 pm  The Surrogate decision maker is: daughter Lahoma Rocker information:h: [phone]; [phone]  Other family members that need to be present: none per daughter  Your patient is able/unable to participate: able to participate in discussion     Valente David, RN 06/13/2012, 4:28 PM Palliative Medicine Team RN Liaison [phone]

## 2012-06-14 DIAGNOSIS — N390 Urinary tract infection, site not specified: Secondary | ICD-10-CM

## 2012-06-14 DIAGNOSIS — R531 Weakness: Secondary | ICD-10-CM

## 2012-06-14 LAB — BASIC METABOLIC PANEL
CO2: 27 mEq/L (ref 19–32)
Calcium: 8.5 mg/dL (ref 8.4–10.5)
Creatinine, Ser: 0.59 mg/dL (ref 0.50–1.10)
Glucose, Bld: 91 mg/dL (ref 70–99)

## 2012-06-14 MED ORDER — POLYETHYLENE GLYCOL 3350 17 G PO PACK: 17.0000 g | PACK | Freq: Every day | ORAL | Status: DC

## 2012-06-14 MED ORDER — ENSURE COMPLETE PO LIQD: 237.0000 mL | Freq: Two times a day (BID) | ORAL | Status: DC

## 2012-06-14 MED ORDER — AMOXICILLIN 500 MG PO CAPS: 500.0000 mg | ORAL_CAPSULE | Freq: Two times a day (BID) | ORAL | Status: DC

## 2012-06-14 MED ORDER — SENNOSIDES-DOCUSATE SODIUM 8.6-50 MG PO TABS: 2.0000 | ORAL_TABLET | Freq: Every day | ORAL | Status: DC

## 2012-06-14 MED ORDER — LEVOTHYROXINE SODIUM 100 MCG PO TABS: 100.0000 ug | ORAL_TABLET | Freq: Every day | ORAL | Status: AC

## 2012-06-14 NOTE — Progress Notes (Signed)
Pt was incontinent of bowel and bladder x 2 this shift. Scanned bladder and noted to have 750 ml of urinary retention. In and out cathed patient with 800cc noted. Pt tolerated procedure well.                         Sukhman Kocher RN

## 2012-06-14 NOTE — Progress Notes (Addendum)
Spoke with Brayton Caves CSW, patient and family request services of [facility] after discharge.   Spoke with daughter, Harriet Pho to initiate education related to hospice services, philosophy and team approach to care with good understanding voiced by Eunice Blase who informs that she is very well acquainted with [facility] services as several family members have been served by [facility] in the past.  Per notes and discuss plan is to d/c today back to [facility] by non-emergent transport; foley catheter to be left in for patient comfort, as per MD notes   DME patient to discharge back to ALF and equipment needs will be addressed by [facility] assessment nurse   Initial paperwork faxed to [facility]  Please notify [facility] when patient is ready to leave unit at d/c call [phone] (or [phone] if after 5 pm);  [facility] information and contact numbers also given to daughter Eunice Blase during phone discussion.   Above information shared with Brayton Caves, CSW Please call with any questions or concerns   Valente David, RN 06/14/2012, 11:27 AM [facility] Palliative Medicine Team RN Liaison [phone]

## 2012-06-14 NOTE — Clinical Social Work Note (Signed)
Clinical Social Worker facilitated patient discharge including contacting facility and family to confirm patient discharge plans.  Patient was a resident at [facility] prior to admission and patient family has requested that she return with [facility] following.  Facility aware of patient changes and agreeable to patient return with hospice following.  CSW arranged ambulance transport via [facility] back to [facility].  CSW spoke with patient daughter at bedside to offer continued support and inform of patient transport plans.  Clinical Social Worker will sign off for now as social work intervention is no longer needed. Please consult [place] again if new need arises.  Macario Golds, [place] [phone]

## 2012-06-14 NOTE — Discharge Summary (Signed)
Physician Discharge Summary  Diana [name] ZOX:[id number] DOB: 15-Sep-1925 DOA: 06/08/2012  PCP: Florentina Jenny, MD  Admit date: 06/08/2012 Discharge date: 06/14/2012  Time spent: 45 minutes  Recommendations for Outpatient Follow-up:  1. May consider referral to urology  2. Patient may benefit from hospice care    Discharge Diagnoses:  Sepsis - resolved    H/O: CVA (cerebrovascular accident)   Hypertension   Blindness and low vision   Dementia   Hypothyroidism   Fall at nursing home   UTI (urinary tract infection)   Hydronephrosis and urinary retention - discharged with foley catheter in    Constipation   Fecal impaction - resolved    Weakness generalized   Discharge Condition: fair   Diet recommendation: soft  Filed Weights   06/12/12 0434 06/13/12 0442 06/14/12 0356  Weight: 64.3 kg (141 lb 12.1 oz) 62.8 kg (138 lb 7.2 oz) 60.2 kg (132 lb 11.5 oz)    History of present illness:   79 year old female with dementia, hypertension, hypothyroidism, minimal bilateral vision and history of stroke living in a NH was brought to the hospital by her daughter for further mental status for possible to 3 days. Patient was seen in the ED fluid is a walker she had a fall and lacerated her scalp which was stapled. The daughter reports that patient has been having frequent falls for the last several weeks and has been nonambulatory. During her recent ED visit she was noted to have UTI and was given a course of ciprofloxacin. At baseline patient has very little vision and is unable to identify people. She is able to carry out a few conversation but is quite confused. Her daughter noticed that for possible to 3 days she was increasingly confused and trying to eat napkins and bed sheets and throwing things up in the air. Patient did not have any fever or chills, nausea or vomiting or complaint of any chest discomfort or shortness of breath. Was not noted to have diarrhea. However daughter noticed  some swelling in her right side of the abdomen and on palpation patient was in discomfort.  Daughter reports that patient has good bowel movements daily  Hospital Course:  Severe Sepsis on admission - as proven by leukocytosis, hypotension, evidence of infection, multiorgan dysfunction,. Source urinary tract infection. Resolved  Altered mental status - probably toxic metabolic encephalopathy  - in the setting of dehydration and UTI.  - improved  Acute kidney injury  Likely mixed secondary to dehydration and obstructive uropathy  Placed on IV hydration on admission - urinary output OK and monitor renal function. Foley placed in the ED and will monitor the urine output closely to assure she does not develop postobsrtuctive polyuria. Urine output stabilized by May 9 and the patient's Foley catheter was removed.  She was unable to void and foley was reinserted on 5/10  Removed foley again 06/13/12 - she again retained and now Not from constipation - foley inserted back and she may benefit from outpatient urology follow up  Atrial fibrillation  New Onset  Continue aspirin.  Patient is not a candidate for anticoagulation since she is unstable on her feet and has a high risk for falls  Severe constipation and fecal impaction  Appeared Resolved with good bowel regimen with Colace and MiraLax initially - then patient retained urine again - AXR 06/12/12 still with fecal impaction - SMOG enema ordered 06/13/12  Repeat AXR 06/13/12 - resolution of fecal impaction -  Enterococcus UTI  started on  rocephin. After culture results available - patient was changed to iv unasyn on May 8.  Patient continued to improved and we changed to oral amoxicillin on May 9 . She needs to have 3 more days of abx at DC  H/O: CVA (cerebrovascular accident)  Continue aspirin  Hypertension  Hypothyroidism  TSH was 7.3 = we did increase synthroid  to 100 mcg daily  Dysphagia  St eval 5/10 - patient on full liquid diet but she  can eat any consistency she wants  Severe protein caloric malnutrition with very little po intake- at risk for dehydration .  Suspect patient will continue to deteriorate - we did refer her to hospice - MOST form signed as well.      Consultations:  Palliative care   Discharge Exam: Filed Vitals:   06/13/12 1408 06/13/12 2032 06/14/12 0356 06/14/12 0410  BP: 123/78 161/92  152/92  Pulse: 77 85  91  Temp: 98 F (36.7 C) 98.4 F (36.9 C)  98.5 F (36.9 C)  TempSrc: Oral Oral  Oral  Resp: 18 16  16   Height:      Weight:   60.2 kg (132 lb 11.5 oz)   SpO2: 96% 96%  95%    General: alert  Cardiovascular: rrr Respiratory: ctab   Discharge Instructions  Discharge Orders   Future Orders Complete By Expires     Diet - low sodium heart healthy  As directed     Increase activity slowly  As directed         Medication List    STOP taking these medications       ciprofloxacin 250 MG tablet  Commonly known as:  CIPRO     lisinopril 5 MG tablet  Commonly known as:  PRINIVIL,ZESTRIL     loperamide 2 MG capsule  Commonly known as:  IMODIUM      TAKE these medications       acetaminophen 500 MG tablet  Commonly known as:  TYLENOL  Take 500 mg by mouth every 6 (six) hours as needed for pain.     amoxicillin 500 MG capsule  Commonly known as:  AMOXIL  Take 1 capsule (500 mg total) by mouth every 12 (twelve) hours.     aspirin EC 81 MG tablet  Take 81 mg by mouth every morning.     BAZA ANTIFUNGAL 2 % cream  Generic drug:  miconazole  Apply 1 application topically 3 (three) times daily. Applies to buttocks     cholecalciferol 1000 UNITS tablet  Commonly known as:  VITAMIN D  Take 1,000 Units by mouth every morning.     Cranberry Fruit 475 MG Caps  Take 475 mg by mouth 2 (two) times daily.     diclofenac sodium 1 % Gel  Commonly known as:  VOLTAREN  Apply 4 g topically 3 (three) times daily. Left knee     escitalopram 5 MG tablet  Commonly known as:   LEXAPRO  Take 5 mg by mouth every morning.     eucerin cream  Apply 1 application topically 2 (two) times daily. To lower extremities     feeding supplement Liqd  Take 237 mLs by mouth 2 (two) times daily between meals.     LACTAID FAST ACT 9000 UNITS Chew  Generic drug:  Lactase  Chew 1 tablet by mouth 3 (three) times daily before meals.     levothyroxine 100 MCG tablet  Commonly known as:  SYNTHROID, LEVOTHROID  Take 1 tablet (  100 mcg total) by mouth daily before breakfast.     Melatonin 3 MG Caps  Take 3 mg by mouth at bedtime.     pilocarpine 1 % ophthalmic solution  Commonly known as:  PILOCAR  Place 1 drop into both eyes 4 (four) times daily.     polyethylene glycol packet  Commonly known as:  MIRALAX / GLYCOLAX  Take 17 g by mouth daily.     senna-docusate 8.6-50 MG per tablet  Commonly known as:  Senokot-S  Take 2 tablets by mouth daily.     timolol 0.5 % ophthalmic solution  Commonly known as:  TIMOPTIC  Place 1 drop into both eyes 2 (two) times daily.     vitamin B-12 1000 MCG tablet  Commonly known as:  CYANOCOBALAMIN  Take 1,000 mcg by mouth daily.       Allergies  Allergen Reactions  . Codeine Other (See Comments)    Per MAR  . Latex Other (See Comments)    Per MAR  . Macrolides And Ketolides Other (See Comments)    Per MAR  . Nitrofurantoin Other (See Comments)    Per MAR  . Oxycodone Other (See Comments)    Per MAR  . Sulfa Antibiotics Other (See Comments)    Per MAR  . Trimethoprim Other (See Comments)    Per MAR       Follow-up Information   Follow up with Florentina Jenny, MD.   Contact information:   [phone] [place] [address] [phone]        The results of significant diagnostics from this hospitalization (including imaging, microbiology, ancillary and laboratory) are listed below for reference.    Significant Diagnostic Studies: Ct Abdomen Pelvis Wo Contrast  06/08/2012  *RADIOLOGY REPORT*  Clinical  Data: Abdominal pain and bloating.  Renal insufficiency.  CT ABDOMEN AND PELVIS WITHOUT CONTRAST  Technique:  Multidetector CT imaging of the abdomen and pelvis was performed following the standard protocol without intravenous contrast.  Comparison: 11/28/2010  Findings: Images which include the lung bases show bilateral dependent atelectasis.  Contrast material the distal esophagus may be related to dysmotility and/or reflux.  No focal abnormalities seen in the liver or spleen on this study performed without intravenous contrast material.  The stomach, duodenum, pancreas, and adrenal glands are unremarkable.  Tiny layering calcified stones are seen in the fundus of the gallbladder.  Moderate right-sided hydroureteronephrosis is noted.  There is a tiny stones evident in the right renal pelvis.  Mild fullness of the left intrarenal collecting system noted, but not nearly to the degree of the obstruction seen on the right.  There is no abdominal aortic aneurysm.  Dense atherosclerotic calcification is seen in the wall of the abdominal aorta.  No abdominal lymphadenopathy.  Imaging through the pelvis shows a marked amount of stool in the rectum.  The rectal vault measures 10.9 x 10.7 cm there is mild circumferential rectal wall thickening.  Mild perirectal edema is seen in the pelvic floor.  No pelvic sidewall lymphadenopathy.  Bladder is markedly distended. Sagittal views show substantial mass effect by the rectal stool volume and bladder distention may be related to a component of bladder outlet obstruction given the rectal dilatation.  No evidence for adnexal mass.  There is some trace fluid in the left para colic gutter.  There appears to be diffuse edema within the small bowel mesentery no evidence for an overt small bowel obstruction.  No substantial free fluid in  the pelvis.  Surgical clips are noted in the right inguinal region.  Old left inferior pubic ramus fracture noted. T11 compression fracture results in  substantial loss of vertebral body height.  Fracture lines are still visible.  Retropulsion of bony into the anterior canal results in spinal stenosis with narrowing of the AP diameter of the canal down to about 8 mm.  IMPRESSION: Extremely large stool volume in the rectal vault with marked distention of the rectum.  There is circumferential rectal wall thickening and perirectal edema is noted within the soft tissues of the pelvic floor.  This is associated with marked bladder distention and the right greater than left hydroureteronephrosis. Sagittal images suggest that there is mass effect on the bladder base raising the question of a component of bladder outlet obstruction related to the large stool volume in the rectum.  Compression fracture at T11 results in posterior bony retropulsion into the spinal canal, narrowing the AP diameter of the canal to approximately 8 mm.  Fracture lines are still visible suggesting this is acute to subacute and chronicity.  Cholelithiasis.  Tiny stones visible in the right renal pelvis.  Trace intraperitoneal fluid with possible edema or congestion within the small bowel mesentery.   Original Report Authenticated By: Kennith Center, M.D.    Dg Chest 2 View  06/04/2012  *RADIOLOGY REPORT*  Clinical Data: Fall and pain.  CHEST - 2 VIEW  Comparison: 12/28/2010  Findings: Two views of the chest were obtained.  There is no evidence for a pneumothorax.  There are low lung volumes.  Stable appearance of the heart and mediastinum.  Bony thorax appears to be intact.  IMPRESSION: Low lung volumes without focal disease.   Original Report Authenticated By: Richarda Overlie, M.D.    Dg Abd 1 View  06/12/2012  *RADIOLOGY REPORT*  Clinical Data: Constipation.  Urinary retention.  ABDOMEN - 1 VIEW  Comparison: CT 06/08/2012.  Findings: The right lateral abdomen is excluded from view on this single frontal radiograph.  There is no gross plain film evidence of free air.  Surgical clips are present over  the right hip, possibly related to vascular surgery or herniorrhaphy. Atherosclerosis.  Lumbar spondylosis.  The bowel gas pattern is nonobstructive.  There are no dilated loops of large or small bowel.  Stool burden appears within normal limits.  Stool and bowel gas are present within the rectosigmoid.  IMPRESSION: Visualized bowel gas pattern within normal limits.   Original Report Authenticated By: Andreas Newport, M.D.    Ct Head Wo Contrast  06/08/2012  *RADIOLOGY REPORT*  Clinical Data: Abdominal pain  CT HEAD WITHOUT CONTRAST  Technique:  Contiguous axial images were obtained from the base of the skull through the vertex without contrast.  Comparison: Head CT 06/04/2012  Findings: No acute intracranial hemorrhage.  No focal mass lesion. No CT evidence of acute infarction.   No midline shift or mass effect.  No hydrocephalus.  Basilar cisterns are patent.  There is an extensive cortical atrophy and concomitant dilatation of the ventricles.  There is periventricular and subcortical white matter hypodensities.  Paranasal sinuses and mastoid air cells are clear.  Orbits are normal.  IMPRESSION:  1.  No acute intracranial findings.  2.  Stable atrophy and microvascular disease.   Original Report Authenticated By: Genevive Bi, M.D.    Ct Head Wo Contrast  06/04/2012  *RADIOLOGY REPORT*  Clinical Data:  Fall, posterior scalp laceration  CT HEAD WITHOUT CONTRAST CT CERVICAL SPINE WITHOUT CONTRAST  Technique:  Multidetector CT imaging of the head and cervical spine was performed following the standard protocol without intravenous contrast.  Multiplanar CT image reconstructions of the cervical spine were also generated.  Comparison:  Most recent prior CT scan of the head 10/01/2011; most recent prior CT scan of the cervical spine 11/08/2010  CT HEAD  Findings: No acute intracranial hemorrhage, acute infarction, mass lesion, mass effect, midline shift or hydrocephalus.  Gray-white differentiation is preserved  throughout.  Stable cerebral and cerebellar atrophy with compensatory ex vacuo dilatation of the lateral ventricles.  Moderate periventricular, subcortical and deep white matter hypoattenuation which is nonspecific but most consistent with the sequela of longstanding microvascular ischemia. Mild focal soft tissue swelling overlying the left posterior occiput consistent with small hematoma.  No underlying calvarial fracture.  The globes are intact.  The orbits are unremarkable. Normal aeration of the mastoid air cells and paranasal sinuses. Atherosclerotic calcification noted in the bilateral internal carotid arteries.  Cerumen impaction in the bilateral external auditory canals.  IMPRESSION:  1.  No acute intracranial abnormality 2.  Stable atrophy and sequela of longstanding microvascular ischemic disease 3.  Cerumen impaction in the bilateral external auditory canals  CT CERVICAL SPINE  Findings: No acute fracture, malalignment or prevertebral soft tissue swelling.  No focal soft tissue abnormality.  Surgical clips noted in the right os neck and sternocleidomastoid muscle.  Mild biapical pleural parenchymal scarring.  No suspicious pulmonary nodule in the visualized portions of the lungs.  The dens is intact.  Focal cervical spondylosis at C5-C6 where posterior disc osteophyte complex results in mild central canal narrowing.  No significant interval progression compared to prior.  IMPRESSION: No acute fracture or malalignment.   Original Report Authenticated By: Malachy Moan, M.D.    Ct Cervical Spine Wo Contrast  06/04/2012  *RADIOLOGY REPORT*  Clinical Data:  Fall, posterior scalp laceration  CT HEAD WITHOUT CONTRAST CT CERVICAL SPINE WITHOUT CONTRAST  Technique:  Multidetector CT imaging of the head and cervical spine was performed following the standard protocol without intravenous contrast.  Multiplanar CT image reconstructions of the cervical spine were also generated.  Comparison:  Most recent prior  CT scan of the head 10/01/2011; most recent prior CT scan of the cervical spine 11/08/2010  CT HEAD  Findings: No acute intracranial hemorrhage, acute infarction, mass lesion, mass effect, midline shift or hydrocephalus.  Gray-white differentiation is preserved throughout.  Stable cerebral and cerebellar atrophy with compensatory ex vacuo dilatation of the lateral ventricles.  Moderate periventricular, subcortical and deep white matter hypoattenuation which is nonspecific but most consistent with the sequela of longstanding microvascular ischemia. Mild focal soft tissue swelling overlying the left posterior occiput consistent with small hematoma.  No underlying calvarial fracture.  The globes are intact.  The orbits are unremarkable. Normal aeration of the mastoid air cells and paranasal sinuses. Atherosclerotic calcification noted in the bilateral internal carotid arteries.  Cerumen impaction in the bilateral external auditory canals.  IMPRESSION:  1.  No acute intracranial abnormality 2.  Stable atrophy and sequela of longstanding microvascular ischemic disease 3.  Cerumen impaction in the bilateral external auditory canals  CT CERVICAL SPINE  Findings: No acute fracture, malalignment or prevertebral soft tissue swelling.  No focal soft tissue abnormality.  Surgical clips noted in the right os neck and sternocleidomastoid muscle.  Mild biapical pleural parenchymal scarring.  No suspicious pulmonary nodule in the visualized portions of the lungs.  The dens is intact.  Focal cervical spondylosis at C5-C6 where posterior disc  osteophyte complex results in mild central canal narrowing.  No significant interval progression compared to prior.  IMPRESSION: No acute fracture or malalignment.   Original Report Authenticated By: Malachy Moan, M.D.    Dg Chest Portable 1 View  06/08/2012  *RADIOLOGY REPORT*  Clinical Data: Evaluate for impaction  PORTABLE CHEST - 1 VIEW  Comparison: 06/04/2012; 11/27/2010  Findings:  Grossly unchanged cardiac silhouette and mediastinal contours.  Lung volumes remain reduced with grossly unchanged bibasilar heterogeneous opacities.  There is persistent mild elevation of the left hemidiaphragm and mild gaseous distension of the splenic flexure of the colon.  No definite pleural effusion or pneumothorax.  Unchanged bones.  IMPRESSION:  1.  No acute cardiopulmonary disease. 2.  Persistently reduced lung volumes with grossly unchanged bibasilar opacities, left greater than right, favored to represent atelectasis.  3.  Mild gaseous distension of the colon within the imaged upper abdomen.   Original Report Authenticated By: Tacey Ruiz, MD    Dg Abd Portable 1v  06/13/2012  *RADIOLOGY REPORT*  Clinical Data: Abdominal pain, possible fecal impaction  PORTABLE ABDOMEN - 1 VIEW  Comparison: KUB of 06/12/2012  Findings: The bowel gas pattern is nonspecific.  No radiographic evidence of fecal impaction is seen.  The bones are osteopenic and there is a lumbar scoliosis present convex to the left with degenerative change.  Calcifications are noted overlying the region of the left kidney and renal calculi cannot be excluded.  IMPRESSION:  1.  No evidence of fecal impaction. 2.  Osteopenia and degenerative change with lumbar scoliosis. 3.  Cannot exclude left renal calculi.   Original Report Authenticated By: Dwyane Dee, M.D.     Microbiology: Recent Results (from the past 240 hour(s))  URINE CULTURE     Status: None   Collection Time    06/08/12  3:16 PM      Result Value Range Status   Specimen Description URINE, CATHETERIZED   Final   Special Requests NONE   Final   Culture  Setup Time 06/08/2012 16:58   Final   Colony Count >=100,000 COLONIES/ML   Final   Culture ENTEROCOCCUS SPECIES   Final   Report Status 06/10/2012 FINAL   Final   Organism ID, Bacteria ENTEROCOCCUS SPECIES   Final  MRSA PCR SCREENING     Status: None   Collection Time    06/08/12 10:02 PM      Result Value Range  Status   MRSA by PCR NEGATIVE  NEGATIVE Final   Comment:            The GeneXpert MRSA Assay (FDA     approved for NASAL specimens     only), is one component of a     comprehensive MRSA colonization     surveillance program. It is not     intended to diagnose MRSA     infection nor to guide or     monitor treatment for     MRSA infections.     Labs: Basic Metabolic Panel:  Recent Labs Lab 06/08/12 1351 06/09/12 0457 06/10/12 0515 06/13/12 0410 06/14/12 0535  NA 123* 130* 134* 129* 130*  K 4.5 4.0 4.0 3.5 3.7  CL 91* 99 104 98 93*  CO2 18* 17* 18* 23 27  GLUCOSE 110* 95 96 98 91  BUN 86* 52* 28* 9 7  CREATININE 2.14* 1.11* 0.69 0.54 0.59  CALCIUM 8.5 7.9* 8.3* 7.8* 8.5   Liver Function Tests:  Recent Labs Lab 06/08/12 1351  AST 84*  ALT 42*  ALKPHOS 95  BILITOT 0.5  PROT 6.5  ALBUMIN 2.6*    Recent Labs Lab 06/08/12 1351  LIPASE 13   No results found for this basename: AMMONIA,  in the last 168 hours CBC:  Recent Labs Lab 06/08/12 1351 06/09/12 0457 06/10/12 0515  WBC 15.8* 12.4* 10.5  NEUTROABS 14.2*  --   --   HGB 10.7* 10.0* 11.4*  HCT 29.1* 28.2* 33.8*  MCV 85.1 87.0 91.8  PLT 329 343 309   Cardiac Enzymes:  Recent Labs Lab 06/08/12 1351  TROPONINI <0.30   BNP: BNP (last 3 results) No results found for this basename: PROBNP,  in the last 8760 hours CBG: No results found for this basename: GLUCAP,  in the last 168 hours     Signed:  Shields Pautz  [facility] 06/14/2012, 8:08 AM

## 2012-06-14 NOTE — Progress Notes (Signed)
IV access removed.  No bleeding, swelling, drainage noted to site.  Pt tolerated removal well.  Family aware of discharge to assisted living today.  Pt daughter at the bedside.  Packet given to EMS personnel. Pt discharged to [facility] per ambulance.

## 2012-07-07 NOTE — Consult Note (Signed)
Agree with above 

## 2013-06-06 ENCOUNTER — Emergency Department (HOSPITAL_COMMUNITY)
Admission: EM | Admit: 2013-06-06 | Discharge: 2013-06-06 | Disposition: A | Discharge status: Home / Self Care | Attending: Emergency Medicine | Admitting: Emergency Medicine

## 2013-06-06 ENCOUNTER — Encounter (HOSPITAL_COMMUNITY): Payer: Self-pay | Admitting: Emergency Medicine

## 2013-06-06 DIAGNOSIS — Z8673 Personal history of transient ischemic attack (TIA), and cerebral infarction without residual deficits: Secondary | ICD-10-CM | POA: Diagnosis not present

## 2013-06-06 DIAGNOSIS — S51019A Laceration without foreign body of unspecified elbow, initial encounter: Secondary | ICD-10-CM

## 2013-06-06 DIAGNOSIS — F0390 Unspecified dementia without behavioral disturbance: Secondary | ICD-10-CM | POA: Insufficient documentation

## 2013-06-06 DIAGNOSIS — F3289 Other specified depressive episodes: Secondary | ICD-10-CM | POA: Diagnosis not present

## 2013-06-06 DIAGNOSIS — Z79899 Other long term (current) drug therapy: Secondary | ICD-10-CM | POA: Diagnosis not present

## 2013-06-06 DIAGNOSIS — Y9389 Activity, other specified: Secondary | ICD-10-CM | POA: Insufficient documentation

## 2013-06-06 DIAGNOSIS — W19XXXA Unspecified fall, initial encounter: Secondary | ICD-10-CM

## 2013-06-06 DIAGNOSIS — S5000XA Contusion of unspecified elbow, initial encounter: Secondary | ICD-10-CM | POA: Diagnosis not present

## 2013-06-06 DIAGNOSIS — Z91040 Latex allergy status: Secondary | ICD-10-CM | POA: Insufficient documentation

## 2013-06-06 DIAGNOSIS — Z791 Long term (current) use of non-steroidal anti-inflammatories (NSAID): Secondary | ICD-10-CM | POA: Insufficient documentation

## 2013-06-06 DIAGNOSIS — S1093XA Contusion of unspecified part of neck, initial encounter: Secondary | ICD-10-CM

## 2013-06-06 DIAGNOSIS — Z8669 Personal history of other diseases of the nervous system and sense organs: Secondary | ICD-10-CM | POA: Diagnosis not present

## 2013-06-06 DIAGNOSIS — S0003XA Contusion of scalp, initial encounter: Secondary | ICD-10-CM | POA: Diagnosis not present

## 2013-06-06 DIAGNOSIS — Z792 Long term (current) use of antibiotics: Secondary | ICD-10-CM | POA: Insufficient documentation

## 2013-06-06 DIAGNOSIS — I10 Essential (primary) hypertension: Secondary | ICD-10-CM | POA: Diagnosis not present

## 2013-06-06 DIAGNOSIS — S59909A Unspecified injury of unspecified elbow, initial encounter: Secondary | ICD-10-CM | POA: Diagnosis present

## 2013-06-06 DIAGNOSIS — F329 Major depressive disorder, single episode, unspecified: Secondary | ICD-10-CM | POA: Insufficient documentation

## 2013-06-06 DIAGNOSIS — R296 Repeated falls: Secondary | ICD-10-CM | POA: Diagnosis not present

## 2013-06-06 DIAGNOSIS — Y9210 Unspecified residential institution as the place of occurrence of the external cause: Secondary | ICD-10-CM | POA: Diagnosis not present

## 2013-06-06 DIAGNOSIS — S51009A Unspecified open wound of unspecified elbow, initial encounter: Secondary | ICD-10-CM | POA: Insufficient documentation

## 2013-06-06 DIAGNOSIS — S0083XA Contusion of other part of head, initial encounter: Secondary | ICD-10-CM | POA: Diagnosis not present

## 2013-06-06 DIAGNOSIS — E079 Disorder of thyroid, unspecified: Secondary | ICD-10-CM | POA: Insufficient documentation

## 2013-06-06 DIAGNOSIS — S0990XA Unspecified injury of head, initial encounter: Secondary | ICD-10-CM

## 2013-06-06 DIAGNOSIS — Y92129 Unspecified place in nursing home as the place of occurrence of the external cause: Secondary | ICD-10-CM

## 2013-06-06 DIAGNOSIS — Z7982 Long term (current) use of aspirin: Secondary | ICD-10-CM | POA: Diagnosis not present

## 2013-06-06 DIAGNOSIS — F411 Generalized anxiety disorder: Secondary | ICD-10-CM | POA: Diagnosis not present

## 2013-06-06 NOTE — ED Notes (Signed)
Bed: WA16 Expected date:  Expected time:  Means of arrival:  Comments: fall 

## 2013-06-06 NOTE — ED Notes (Signed)
[facility]. Per EMS, staff found pt on floor in her room with a large hematoma on the left side of her head and a skin tear on her left elbow. Pt has hx of dementia, staff state pt's mental status is at baseline. Pt's jaw is wired shut. EMS states pt was brought to ED because the hospice nurse was not at the facility. Pt refused to wear c-collar. Pt alert and oriented.

## 2013-06-06 NOTE — ED Provider Notes (Signed)
Medical screening examination/treatment/procedure(s) were performed by non-physician practitioner and as supervising physician I was immediately available for consultation/collaboration.   EKG Interpretation None        Maryfer Tauzin L Jasiah Elsen, MD 06/06/13 2236 

## 2013-06-06 NOTE — ED Provider Notes (Signed)
CSN: [id number]     Arrival date & time 06/06/13  1604 History   First MD Initiated Contact with Patient 06/06/13 1611     Chief Complaint  Patient presents with  . Fall     (Consider location/radiation/quality/duration/timing/severity/associated sxs/prior Treatment) HPI  80 year old female with hx of thyroid disease, cva, blindness, dementia currently on hospice care was brought here via EMS for evaluation of recent fall.  Per EMS[facility] pt on the floor in her room with large hematoma to the left side of her head and skin tear on her L elbow.  Her mental status is at baseline per staff.  The reason she was brought here is because the hospice nurse was not in the facility. Patient currently denies having any significant pain.  Level V caveat applies due to dementia.  Patient refused to wear her c-collar. Pt is DNR/DNI, no abx administration.      Past Medical History  Diagnosis Date  . Thyroid disease   . Hypertension   . Blind   . Anxiety   . Depression   . CVA (cerebral vascular accident)    Past Surgical History  Procedure Laterality Date  . Appendectomy    . Abdominal hysterectomy    . Eye surgery     No family history on file. History  Substance Use Topics  . Smoking status: Never Smoker   . Smokeless tobacco: Not on file  . Alcohol Use: No   OB History   Grav Para Term Preterm Abortions TAB SAB Ect Mult Living                 Review of Systems  Unable to perform ROS: Dementia      Allergies  Codeine; Latex; Macrolides and ketolides; Nitrofurantoin; Oxycodone; Sulfa antibiotics; and Trimethoprim  Home Medications   Prior to Admission medications   Medication Sig Start Date End Date Taking? Authorizing Provider  acetaminophen (TYLENOL) 500 MG tablet Take 500 mg by mouth every 6 (six) hours as needed for pain.     Historical Provider, MD  amoxicillin (AMOXIL) 500 MG capsule Take 1 capsule (500 mg total) by mouth every 12 (twelve)  hours. 06/14/12   Sorin Luanne Bras Laza, MD  aspirin EC 81 MG tablet Take 81 mg by mouth every morning.     Historical Provider, MD  cholecalciferol (VITAMIN D) 1000 UNITS tablet Take 1,000 Units by mouth every morning.     Historical Provider, MD  Cranberry Fruit 475 MG CAPS Take 475 mg by mouth 2 (two) times daily.    Historical Provider, MD  diclofenac sodium (VOLTAREN) 1 % GEL Apply 4 g topically 3 (three) times daily. Left knee    Historical Provider, MD  escitalopram (LEXAPRO) 5 MG tablet Take 5 mg by mouth every morning.    Historical Provider, MD  feeding supplement (ENSURE COMPLETE) LIQD Take 237 mLs by mouth 2 (two) times daily between meals. 06/14/12   Sorin Luanne Bras Laza, MD  Lactase (LACTAID FAST ACT) 9000 UNITS CHEW Chew 1 tablet by mouth 3 (three) times daily before meals.    Historical Provider, MD  levothyroxine (SYNTHROID, LEVOTHROID) 100 MCG tablet Take 1 tablet (100 mcg total) by mouth daily before breakfast. 06/14/12   Sorin Luanne Bras Laza, MD  Melatonin 3 MG CAPS Take 3 mg by mouth at bedtime.    Historical Provider, MD  miconazole (BAZA ANTIFUNGAL) 2 % cream Apply 1 application topically 3 (three) times daily. Applies to buttocks  Historical Provider, MD  pilocarpine (PILOCAR) 1 % ophthalmic solution Place 1 drop into both eyes 4 (four) times daily.    Historical Provider, MD  polyethylene glycol (MIRALAX / GLYCOLAX) packet Take 17 g by mouth daily. 06/14/12   Sorin Luanne Bras Laza, MD  senna-docusate (SENOKOT-S) 8.6-50 MG per tablet Take 2 tablets by mouth daily. 06/14/12   Sorin Luanne Bras Laza, MD  Skin Protectants, Misc. (EUCERIN) cream Apply 1 application topically 2 (two) times daily. To lower extremities    Historical Provider, MD  timolol (TIMOPTIC) 0.5 % ophthalmic solution Place 1 drop into both eyes 2 (two) times daily.    Historical Provider, MD  vitamin B-12 (CYANOCOBALAMIN) 1000 MCG tablet Take 1,000 mcg by mouth daily.    Historical Provider, MD   BP 155/92  Pulse 86  Temp(Src) 98.3 F (36.8 C) (Oral)   Resp 16  SpO2 100% Physical Exam  Nursing note and vitals reviewed. Constitutional:  Cachectic appearing Caucasian female appears to be in no acute distress  HENT:  Head: Normocephalic.  Large hematoma noted to left forehead, mildly tender to palpation without crepitus.  Eyes: Conjunctivae are normal.  Neck:  No cervical spine tenderness  Cardiovascular: Normal rate and regular rhythm.   Pulmonary/Chest: Effort normal and breath sounds normal.  Abdominal: Soft. There is no tenderness.  Musculoskeletal: She exhibits tenderness (Small skin tears and bruising noted to left elbow with normal elbow flexion and extension. No deformity noted to).  Neurological:  Alert to self only.  Skin: No rash noted.    ED Course  Procedures (including critical care time)  4:41 PM Pt is a hospice pt, here for unwitnessed fall.  Hx of dementia, unable to obtain hx.  Has hematoma to forehead and small skin tear to L elbow.  Dressing placed.  Pt is at baseline mentation and in NAD. Unsure LOC.  I have contacted pt's daughter, who is the power of attorney to notify plan.  Pt will be discharge to return to her facility. Care discussed with Dr. Estell HarpinZammit.    Will defer advance imaging and further work up given her DNR/DNI status and also pt is at her baseline.  Daughter agrees with plan.    Labs Review Labs Reviewed - No data to display  Imaging Review No results found.   EKG Interpretation None      MDM   Final diagnoses:  Fall at nursing home  Traumatic hematoma of forehead  Skin tear of elbow without complication  Head injury, acute    BP 155/92  Pulse 86  Temp(Src) 98.3 F (36.8 C) (Oral)  Resp 16  SpO2 100%  I have reviewed nursing notes and vital signs. I reviewed available ER/hospitalization records thought the EMR     Fayrene HelperBowie Klair [place], [place] JerseyPA-C 06/06/13 1701

## 2013-06-06 NOTE — Discharge Instructions (Signed)
Please apply ice pack to hematoma on forehead and keep it on for 10-15 min at a time.  Apply neosporin to skin tear and apply dressing.     Blunt Trauma You have been evaluated for injuries. You have been examined and your caregiver has not found injuries serious enough to require hospitalization. It is common to have multiple bruises and sore muscles following an accident. These tend to feel worse for the first 24 hours. You will feel more stiffness and soreness over the next several hours and worse when you wake up the first morning after your accident. After this point, you should begin to improve with each passing day. The amount of improvement depends on the amount of damage done in the accident. Following your accident, if some part of your body does not work as it should, or if the pain in any area continues to increase, you should return to the Emergency Department for re-evaluation.  HOME CARE INSTRUCTIONS  Routine care for sore areas should include:  Ice to sore areas every 2 hours for 20 minutes while awake for the next 2 days.  Drink extra fluids (not alcohol).  Take a hot or warm shower or bath once or twice a day to increase blood flow to sore muscles. This will help you "limber up".  Activity as tolerated. Lifting may aggravate neck or back pain.  Only take over-the-counter or prescription medicines for pain, discomfort, or fever as directed by your caregiver. Do not use aspirin. This may increase bruising or increase bleeding if there are small areas where this is happening. SEEK IMMEDIATE MEDICAL CARE IF:  Numbness, tingling, weakness, or problem with the use of your arms or legs.  A severe headache is not relieved with medications.  There is a change in bowel or bladder control.  Increasing pain in any areas of the body.  Short of breath or dizzy.  Nauseated, vomiting, or sweating.  Increasing belly (abdominal) discomfort.  Blood in urine, stool, or vomiting  blood.  Pain in either shoulder in an area where a shoulder strap would be.  Feelings of lightheadedness or if you have a fainting episode. Sometimes it is not possible to identify all injuries immediately after the trauma. It is important that you continue to monitor your condition after the emergency department visit. If you feel you are not improving, or improving more slowly than should be expected, call your physician. If you feel your symptoms (problems) are worsening, return to the Emergency Department immediately. Document Released: 10/15/2000 Document Revised: 04/13/2011 Document Reviewed: 09/07/2007 [facility] Patient Information 2014 [place], [place].

## 2013-06-06 NOTE — ED Notes (Signed)
[facility] called again, states ambulances are still busy.

## 2013-08-03 ENCOUNTER — Observation Stay (HOSPITAL_COMMUNITY)
Admission: EM | Admit: 2013-08-03 | Discharge: 2013-08-04 | Disposition: A | Discharge status: Nursing Home (Includes ICF) | Attending: Internal Medicine | Admitting: Internal Medicine

## 2013-08-03 ENCOUNTER — Emergency Department (HOSPITAL_COMMUNITY)

## 2013-08-03 ENCOUNTER — Encounter (HOSPITAL_COMMUNITY): Payer: Self-pay | Admitting: Emergency Medicine

## 2013-08-03 DIAGNOSIS — F411 Generalized anxiety disorder: Secondary | ICD-10-CM | POA: Insufficient documentation

## 2013-08-03 DIAGNOSIS — E039 Hypothyroidism, unspecified: Secondary | ICD-10-CM | POA: Insufficient documentation

## 2013-08-03 DIAGNOSIS — F0391 Unspecified dementia with behavioral disturbance: Secondary | ICD-10-CM

## 2013-08-03 DIAGNOSIS — Y9210 Unspecified residential institution as the place of occurrence of the external cause: Secondary | ICD-10-CM | POA: Insufficient documentation

## 2013-08-03 DIAGNOSIS — Y92129 Unspecified place in nursing home as the place of occurrence of the external cause: Secondary | ICD-10-CM

## 2013-08-03 DIAGNOSIS — W06XXXA Fall from bed, initial encounter: Secondary | ICD-10-CM | POA: Insufficient documentation

## 2013-08-03 DIAGNOSIS — F03918 Unspecified dementia, unspecified severity, with other behavioral disturbance: Secondary | ICD-10-CM

## 2013-08-03 DIAGNOSIS — W19XXXA Unspecified fall, initial encounter: Secondary | ICD-10-CM

## 2013-08-03 DIAGNOSIS — Z79899 Other long term (current) drug therapy: Secondary | ICD-10-CM | POA: Insufficient documentation

## 2013-08-03 DIAGNOSIS — S0003XA Contusion of scalp, initial encounter: Secondary | ICD-10-CM | POA: Insufficient documentation

## 2013-08-03 DIAGNOSIS — Z7982 Long term (current) use of aspirin: Secondary | ICD-10-CM | POA: Insufficient documentation

## 2013-08-03 DIAGNOSIS — F3289 Other specified depressive episodes: Secondary | ICD-10-CM | POA: Insufficient documentation

## 2013-08-03 DIAGNOSIS — Z9181 History of falling: Secondary | ICD-10-CM | POA: Insufficient documentation

## 2013-08-03 DIAGNOSIS — N390 Urinary tract infection, site not specified: Principal | ICD-10-CM

## 2013-08-03 DIAGNOSIS — F329 Major depressive disorder, single episode, unspecified: Secondary | ICD-10-CM | POA: Insufficient documentation

## 2013-08-03 DIAGNOSIS — I10 Essential (primary) hypertension: Secondary | ICD-10-CM

## 2013-08-03 DIAGNOSIS — S0083XA Contusion of other part of head, initial encounter: Secondary | ICD-10-CM | POA: Insufficient documentation

## 2013-08-03 DIAGNOSIS — Z8673 Personal history of transient ischemic attack (TIA), and cerebral infarction without residual deficits: Secondary | ICD-10-CM

## 2013-08-03 DIAGNOSIS — R531 Weakness: Secondary | ICD-10-CM

## 2013-08-03 DIAGNOSIS — Z66 Do not resuscitate: Secondary | ICD-10-CM | POA: Insufficient documentation

## 2013-08-03 DIAGNOSIS — H5410 Blindness, one eye, low vision other eye, unspecified eyes: Secondary | ICD-10-CM

## 2013-08-03 DIAGNOSIS — F0390 Unspecified dementia without behavioral disturbance: Secondary | ICD-10-CM

## 2013-08-03 DIAGNOSIS — Z9089 Acquired absence of other organs: Secondary | ICD-10-CM | POA: Insufficient documentation

## 2013-08-03 DIAGNOSIS — S1093XA Contusion of unspecified part of neck, initial encounter: Secondary | ICD-10-CM

## 2013-08-03 DIAGNOSIS — K5641 Fecal impaction: Secondary | ICD-10-CM

## 2013-08-03 DIAGNOSIS — S0990XA Unspecified injury of head, initial encounter: Secondary | ICD-10-CM

## 2013-08-03 DIAGNOSIS — W19XXXD Unspecified fall, subsequent encounter: Secondary | ICD-10-CM

## 2013-08-03 DIAGNOSIS — Z90710 Acquired absence of both cervix and uterus: Secondary | ICD-10-CM | POA: Insufficient documentation

## 2013-08-03 HISTORY — DX: Unspecified atrial fibrillation: I48.91

## 2013-08-03 HISTORY — DX: Insomnia, unspecified: G47.00

## 2013-08-03 HISTORY — DX: Unspecified dementia, unspecified severity, without behavioral disturbance, psychotic disturbance, mood disturbance, and anxiety: F03.90

## 2013-08-03 HISTORY — DX: Hypothyroidism, unspecified: E03.9

## 2013-08-03 LAB — URINALYSIS, ROUTINE W REFLEX MICROSCOPIC
BILIRUBIN URINE: NEGATIVE
GLUCOSE, UA: NEGATIVE mg/dL
HGB URINE DIPSTICK: NEGATIVE
KETONES UR: NEGATIVE mg/dL
Nitrite: POSITIVE — AB
PH: 6.5 (ref 5.0–8.0)
Protein, ur: NEGATIVE mg/dL
SPECIFIC GRAVITY, URINE: 1.015 (ref 1.005–1.030)
Urobilinogen, UA: 0.2 mg/dL (ref 0.0–1.0)

## 2013-08-03 LAB — CBC WITH DIFFERENTIAL/PLATELET
Basophils Absolute: 0 10*3/uL (ref 0.0–0.1)
Basophils Relative: 0 % (ref 0–1)
EOS ABS: 0.2 10*3/uL (ref 0.0–0.7)
EOS PCT: 2 % (ref 0–5)
HCT: 39.3 % (ref 36.0–46.0)
HEMOGLOBIN: 13.7 g/dL (ref 12.0–15.0)
LYMPHS ABS: 1.8 10*3/uL (ref 0.7–4.0)
LYMPHS PCT: 18 % (ref 12–46)
MCH: 33.7 pg (ref 26.0–34.0)
MCHC: 34.9 g/dL (ref 30.0–36.0)
MCV: 96.8 fL (ref 78.0–100.0)
MONOS PCT: 6 % (ref 3–12)
Monocytes Absolute: 0.6 10*3/uL (ref 0.1–1.0)
NEUTROS PCT: 74 % (ref 43–77)
Neutro Abs: 7.6 10*3/uL (ref 1.7–7.7)
Platelets: 263 10*3/uL (ref 150–400)
RBC: 4.06 MIL/uL (ref 3.87–5.11)
RDW: 12.8 % (ref 11.5–15.5)
WBC: 10.2 10*3/uL (ref 4.0–10.5)

## 2013-08-03 LAB — BASIC METABOLIC PANEL
Anion gap: 12 (ref 5–15)
BUN: 31 mg/dL — AB (ref 6–23)
CO2: 28 meq/L (ref 19–32)
Calcium: 10.1 mg/dL (ref 8.4–10.5)
Chloride: 98 mEq/L (ref 96–112)
Creatinine, Ser: 0.69 mg/dL (ref 0.50–1.10)
GFR calc Af Amer: 89 mL/min — ABNORMAL LOW (ref >90)
GFR, EST NON AFRICAN AMERICAN: 77 mL/min — AB (ref >90)
GLUCOSE: 115 mg/dL — AB (ref 70–99)
POTASSIUM: 4.7 meq/L (ref 3.7–5.3)
Sodium: 138 mEq/L (ref 137–147)

## 2013-08-03 LAB — URINE MICROSCOPIC-ADD ON

## 2013-08-03 LAB — I-STAT TROPONIN, ED: Troponin i, poc: 0 ng/mL (ref 0.00–0.08)

## 2013-08-03 MED ORDER — DEXTROSE 5 % IV SOLN
1.0000 g | Freq: Once | INTRAVENOUS | Status: AC
  Administered 2013-08-04: 1 g via INTRAVENOUS
  Filled 2013-08-03: qty 10

## 2013-08-03 MED ORDER — LORAZEPAM 2 MG/ML IJ SOLN
1.0000 mg | Freq: Once | INTRAMUSCULAR | Status: AC
  Administered 2013-08-04: 1 mg via INTRAVENOUS
  Filled 2013-08-03: qty 1

## 2013-08-03 MED ORDER — SODIUM CHLORIDE 0.9 % IV BOLUS (SEPSIS)
500.0000 mL | Freq: Once | INTRAVENOUS | Status: AC
  Administered 2013-08-04: 500 mL via INTRAVENOUS

## 2013-08-03 NOTE — ED Notes (Signed)
Dr. Yao at beside  

## 2013-08-03 NOTE — H&P (Signed)
PCP:   Florentina JennyRIPP, HENRY, MD   Chief Complaint:  agitated  HPI: 80 yo female with advanced dementia, htn, prev cva bedbound state who lives at snf with hospice comes in with daughter for severe agitation today.  dtr says she only gets this way when she gets a bladder infection.  No cough.  No n/v/d.  dtr very worried that she will be in pain at the snf.  Does not wish for her mom to get ivf or any antibiotics.  She knows her mom would not want anything to prolong her life at this stage of her dementia including ivf and abx.  Daughter would like to speak to someone about a possible hospice house.  Pt got very agitated today, fell while trying to get out of bed and has bruising to her face now.  Review of Systems:  Unobtainable from pt  Past Medical History: Past Medical History  Diagnosis Date  . Thyroid disease   . Hypertension   . Blind   . Anxiety   . Depression   . CVA (cerebral vascular accident)   . Dementia   . Hypothyroid   . A-fib   . Insomnia    Past Surgical History  Procedure Laterality Date  . Appendectomy    . Abdominal hysterectomy    . Eye surgery      Medications: Prior to Admission medications   Medication Sig Start Date End Date Taking? Authorizing Provider  acetaminophen (TYLENOL) 500 MG tablet Take 500 mg by mouth every 6 (six) hours as needed for mild pain.    Yes Historical Provider, MD  aspirin EC 81 MG tablet Take 81 mg by mouth every morning.    Yes Historical Provider, MD  azelastine (OPTIVAR) 0.05 % ophthalmic solution Place 1 drop into both eyes 2 (two) times daily.   Yes Historical Provider, MD  escitalopram (LEXAPRO) 5 MG tablet Take 5 mg by mouth every morning.   Yes Historical Provider, MD  levothyroxine (SYNTHROID, LEVOTHROID) 100 MCG tablet Take 1 tablet (100 mcg total) by mouth daily before breakfast. 06/14/12  Yes Sorin Luanne Bras Laza, MD  LORazepam (ATIVAN) 0.5 MG tablet Take 0.5 mg by mouth every 8 (eight) hours as needed for anxiety.   Yes  Historical Provider, MD  pilocarpine (PILOCAR) 1 % ophthalmic solution Place 1 drop into both eyes 4 (four) times daily.   Yes Historical Provider, MD  senna-docusate (SENOKOT-S) 8.6-50 MG per tablet Take 1 tablet by mouth at bedtime. 06/14/12  Yes Sorin Luanne Bras Laza, MD  Silicone-Lanolin [facility](SELAN EX) Apply 1 application topically as needed (dry skin).   Yes Historical Provider, MD  Skin Protectants, Misc. (EUCERIN) cream Apply 1 application topically 2 (two) times daily. To lower extremities   Yes Historical Provider, MD  timolol (TIMOPTIC) 0.5 % ophthalmic solution Place 1 drop into both eyes 2 (two) times daily.   Yes Historical Provider, MD  traMADol (ULTRAM) 50 MG tablet Take 50 mg by mouth 2 (two) times daily.   Yes Historical Provider, MD  zolpidem (AMBIEN) 5 MG tablet Take 5 mg by mouth at bedtime.   Yes Historical Provider, MD    Allergies:   Allergies  Allergen Reactions  . Codeine Other (See Comments)    Per MAR  . Latex Other (See Comments)    Per MAR  . Macrolides And Ketolides Other (See Comments)    Per MAR  . Nitrofurantoin Other (See Comments)    Per MAR  . Oxycodone Other (See Comments)  Per MAR  . Sulfa Antibiotics Other (See Comments)    Per MAR  . Trimethoprim Other (See Comments)    Per MAR    Social History:  reports that she has never smoked. She does not have any smokeless tobacco history on file. She reports that she does not drink alcohol or use illicit drugs.  Family History:   Physical Exam: Filed Vitals:   08/03/13 1933 08/03/13 1939 08/03/13 2200 08/03/13 2230  BP:  149/97 142/101 135/85  Pulse:  102 123   Temp:  98.2 F (36.8 C)    TempSrc:  Oral    Resp:  18 14 18   SpO2: 100% 97% 96%    General appearance: alert, combative and no distress Head: Normocephalic, without obvious abnormality, scalp contusion Eyes: negative Nose: Nares normal. Septum midline. Mucosa normal. No drainage or sinus tenderness. Neck: no JVD and supple, symmetrical,  trachea midline Lungs: clear to auscultation bilaterally Heart: regular rate and rhythm, S1, S2 normal, no murmur, click, rub or gallop Abdomen: soft, non-tender; bowel sounds normal; no masses,  no organomegaly Extremities: extremities normal, atraumatic, no cyanosis or edema Pulses: 2+ and symmetric Skin: Skin color, texture, turgor normal. No rashes or lesions Neurologic: Grossly normal    Labs on Admission:   Recent Labs  08/03/13 2219  NA 138  K 4.7  CL 98  CO2 28  GLUCOSE 115*  BUN 31*  CREATININE 0.69  CALCIUM 10.1    Recent Labs  08/03/13 2219  WBC 10.2  NEUTROABS 7.6  HGB 13.7  HCT 39.3  MCV 96.8  PLT 263   Radiological Exams on Admission: Ct Head Wo Contrast  08/03/2013   CLINICAL DATA:  Fall. Laceration to bridge of nose, progressive confusion.  EXAM: CT HEAD WITHOUT CONTRAST  CT MAXILLOFACIAL WITHOUT CONTRAST  CT CERVICAL SPINE WITHOUT CONTRAST  TECHNIQUE: Multidetector CT imaging of the head, cervical spine, and maxillofacial structures were performed using the standard protocol without intravenous contrast. Multiplanar CT image reconstructions of the cervical spine and maxillofacial structures were also generated.  COMPARISON:  None.  FINDINGS: CT HEAD FINDINGS  There is mild diffuse low-attenuation within the subcortical and periventricular white matter compatible with chronic microvascular disease. No acute cortical infarct, hemorrhage, or mass lesion ispresent. Ventricles are of normal size. No significant extra-axial fluid collection is present. The paranasal sinuses andmastoid air cells are clear. The osseous skull is intact.  CT MAXILLOFACIAL FINDINGS  Previous hardware fixation of the mandible. Chronic fracture involving the right zygomatic arch is noted. Mild mucosal thickening involves the maxillary sinuses and frontal sinus.  CT CERVICAL SPINE FINDINGS  Normal alignment of the cervical spine. The vertebral body heights are well preserved. The prevertebral  soft tissue space is normal. Bilateral facet hypertrophy and degenerative change noted. No fractures or subluxations. Carotid artery calcifications noted. 7 mm nodule within the right upper lobe is noted. This is unchanged from 5/3/ 14 and 11/08/2010 compatible with a benign finding.  IMPRESSION: 1. No acute intracranial abnormalities. 2. Small vessel ischemic change and brain atrophy. 3. Previous hardware fixation of the mandible. 4. No acute cervical spine findings.   Electronically Signed   By: Signa Kellaylor  Stroud M.D.   On: 08/03/2013 21:03   Ct Cervical Spine Wo Contrast  08/03/2013   CLINICAL DATA:  Fall. Laceration to bridge of nose, progressive confusion.  EXAM: CT HEAD WITHOUT CONTRAST  CT MAXILLOFACIAL WITHOUT CONTRAST  CT CERVICAL SPINE WITHOUT CONTRAST  TECHNIQUE: Multidetector CT imaging of the head, cervical  spine, and maxillofacial structures were performed using the standard protocol without intravenous contrast. Multiplanar CT image reconstructions of the cervical spine and maxillofacial structures were also generated.  COMPARISON:  None.  FINDINGS: CT HEAD FINDINGS  There is mild diffuse low-attenuation within the subcortical and periventricular white matter compatible with chronic microvascular disease. No acute cortical infarct, hemorrhage, or mass lesion ispresent. Ventricles are of normal size. No significant extra-axial fluid collection is present. The paranasal sinuses andmastoid air cells are clear. The osseous skull is intact.  CT MAXILLOFACIAL FINDINGS  Previous hardware fixation of the mandible. Chronic fracture involving the right zygomatic arch is noted. Mild mucosal thickening involves the maxillary sinuses and frontal sinus.  CT CERVICAL SPINE FINDINGS  Normal alignment of the cervical spine. The vertebral body heights are well preserved. The prevertebral soft tissue space is normal. Bilateral facet hypertrophy and degenerative change noted. No fractures or subluxations. Carotid artery  calcifications noted. 7 mm nodule within the right upper lobe is noted. This is unchanged from 5/3/ 14 and 11/08/2010 compatible with a benign finding.  IMPRESSION: 1. No acute intracranial abnormalities. 2. Small vessel ischemic change and brain atrophy. 3. Previous hardware fixation of the mandible. 4. No acute cervical spine findings.   Electronically Signed   By: Signa Kell M.D.   On: 08/03/2013 21:03   Ct Maxillofacial Wo Cm  08/03/2013   CLINICAL DATA:  Fall. Laceration to bridge of nose, progressive confusion.  EXAM: CT HEAD WITHOUT CONTRAST  CT MAXILLOFACIAL WITHOUT CONTRAST  CT CERVICAL SPINE WITHOUT CONTRAST  TECHNIQUE: Multidetector CT imaging of the head, cervical spine, and maxillofacial structures were performed using the standard protocol without intravenous contrast. Multiplanar CT image reconstructions of the cervical spine and maxillofacial structures were also generated.  COMPARISON:  None.  FINDINGS: CT HEAD FINDINGS  There is mild diffuse low-attenuation within the subcortical and periventricular white matter compatible with chronic microvascular disease. No acute cortical infarct, hemorrhage, or mass lesion ispresent. Ventricles are of normal size. No significant extra-axial fluid collection is present. The paranasal sinuses andmastoid air cells are clear. The osseous skull is intact.  CT MAXILLOFACIAL FINDINGS  Previous hardware fixation of the mandible. Chronic fracture involving the right zygomatic arch is noted. Mild mucosal thickening involves the maxillary sinuses and frontal sinus.  CT CERVICAL SPINE FINDINGS  Normal alignment of the cervical spine. The vertebral body heights are well preserved. The prevertebral soft tissue space is normal. Bilateral facet hypertrophy and degenerative change noted. No fractures or subluxations. Carotid artery calcifications noted. 7 mm nodule within the right upper lobe is noted. This is unchanged from 5/3/ 14 and 11/08/2010 compatible with a  benign finding.  IMPRESSION: 1. No acute intracranial abnormalities. 2. Small vessel ischemic change and brain atrophy. 3. Previous hardware fixation of the mandible. 4. No acute cervical spine findings.   Electronically Signed   By: Signa Kell M.D.   On: 08/03/2013 21:03    Assessment/Plan  80 yo female with uti, agitation with underlying dementia  Principal Problem:   UTI (urinary tract infection)  No abx at this time.  Active Problems:   H/O: CVA (cerebrovascular accident)   Hypertension   Dementia   Fall at nursing home  DNR.  Daughter is only child.  Does not want ivf or abx at this time.  Will need palliative care consult to go over options of returning to the snf with hospice or [facility].    Claretha Townshend A 08/03/2013, 11:39 PM

## 2013-08-03 NOTE — ED Notes (Signed)
[facility]--not witnessed by staff No LOC per patent and staff Contusion and lac noted to bridge of nose Patient with hx of dementia--per EMS staff, patient was talking to people who are not present--this is baseline per EMS staff Ecchymosis to face, especially the right side of the face noted upon arrival

## 2013-08-03 NOTE — ED Notes (Signed)
Bed: WA09 Expected date:  Expected time:  Means of arrival:  Comments: EMS/80 yo with fall-pain right wrist and right leg/hypertensive

## 2013-08-03 NOTE — ED Provider Notes (Signed)
CSN: [id number]     Arrival date & time 08/03/13  1933 History   First MD Initiated Contact with Patient 08/03/13 1947     Chief Complaint  Patient presents with  . Fall    Patient has a bruise on the corner of the inner part of both eyes. Patient has a scratch on bridge of nose that has clotted. A bruise on the right cheek.     (Consider location/radiation/quality/duration/timing/severity/associated sxs/prior Treatment) The history is provided by the patient.  Diana [name] is a 80 y.o. female hx of HTN, CVA, on hospice here with fall. She lives at [facility] in the memory care unit. She was found on the floor by staff. Patient has frequent falls. Patient was noted to have ecchymosis on her face and laceration to the bridge of nose that is new today. Patient demented and doesn't remember falling. Patient has been talking to people who are not there but that is baseline.    Level V caveat- dementia    Past Medical History  Diagnosis Date  . Thyroid disease   . Hypertension   . Blind   . Anxiety   . Depression   . CVA (cerebral vascular accident)   . Dementia   . Hypothyroid   . A-fib   . Insomnia    Past Surgical History  Procedure Laterality Date  . Appendectomy    . Abdominal hysterectomy    . Eye surgery     No family history on file. History  Substance Use Topics  . Smoking status: Never Smoker   . Smokeless tobacco: Not on file  . Alcohol Use: No   OB History   Grav Para Term Preterm Abortions TAB SAB Ect Mult Living                 Review of Systems  Unable to perform ROS: Dementia      Allergies  Codeine; Latex; Macrolides and ketolides; Nitrofurantoin; Oxycodone; Sulfa antibiotics; and Trimethoprim  Home Medications   Prior to Admission medications   Medication Sig Start Date End Date Taking? Authorizing Provider  acetaminophen (TYLENOL) 500 MG tablet Take 500 mg by mouth every 6 (six) hours as needed for mild pain.    Yes Historical  Provider, MD  aspirin EC 81 MG tablet Take 81 mg by mouth every morning.    Yes Historical Provider, MD  azelastine (OPTIVAR) 0.05 % ophthalmic solution Place 1 drop into both eyes 2 (two) times daily.   Yes Historical Provider, MD  escitalopram (LEXAPRO) 5 MG tablet Take 5 mg by mouth every morning.   Yes Historical Provider, MD  levothyroxine (SYNTHROID, LEVOTHROID) 100 MCG tablet Take 1 tablet (100 mcg total) by mouth daily before breakfast. 06/14/12  Yes Sorin Luanne Bras, MD  LORazepam (ATIVAN) 0.5 MG tablet Take 0.5 mg by mouth every 8 (eight) hours as needed for anxiety.   Yes Historical Provider, MD  pilocarpine (PILOCAR) 1 % ophthalmic solution Place 1 drop into both eyes 4 (four) times daily.   Yes Historical Provider, MD  senna-docusate (SENOKOT-S) 8.6-50 MG per tablet Take 1 tablet by mouth at bedtime. 06/14/12  Yes Sorin Luanne Bras, MD  Silicone-Lanolin [facility] EX) Apply 1 application topically as needed (dry skin).   Yes Historical Provider, MD  Skin Protectants, Misc. (EUCERIN) cream Apply 1 application topically 2 (two) times daily. To lower extremities   Yes Historical Provider, MD  timolol (TIMOPTIC) 0.5 % ophthalmic solution Place 1 drop into both eyes  2 (two) times daily.   Yes Historical Provider, MD  traMADol (ULTRAM) 50 MG tablet Take 50 mg by mouth 2 (two) times daily.   Yes Historical Provider, MD  zolpidem (AMBIEN) 5 MG tablet Take 5 mg by mouth at bedtime.   Yes Historical Provider, MD   BP 135/85  Pulse 123  Temp(Src) 98.2 F (36.8 C) (Oral)  Resp 18  SpO2 96% Physical Exam  Nursing note and vitals reviewed. Constitutional:  Chronically ill   HENT:  Small 1 cm laceration on bridge of nose with ecchymosis on nose and spreading to the eyes.   Eyes: Conjunctivae and EOM are normal. Pupils are equal, round, and reactive to light.  Neck: Normal range of motion. Neck supple.  Cardiovascular: Normal rate, regular rhythm and normal heart sounds.   Pulmonary/Chest: Effort normal  and breath sounds normal. No respiratory distress. She has no wheezes. She has no rales.  Abdominal: Soft. Bowel sounds are normal. She exhibits no distension. There is no tenderness. There is no rebound.  Musculoskeletal: Normal range of motion.  No obvious extremity trauma. Pelvis stable. Nl ROM bilateral hips   Neurological:  Demented, moving all extremities   Skin: Skin is warm and dry.  Psychiatric:  Unable     ED Course  Procedures (including critical care time) Labs Review Labs Reviewed  BASIC METABOLIC PANEL - Abnormal; Notable for the following:    Glucose, Bld 115 (*)    BUN 31 (*)    GFR calc non Af Amer 77 (*)    GFR calc Af Amer 89 (*)    All other components within normal limits  URINALYSIS, ROUTINE W REFLEX MICROSCOPIC - Abnormal; Notable for the following:    APPearance CLOUDY (*)    Nitrite POSITIVE (*)    Leukocytes, UA SMALL (*)    All other components within normal limits  URINE MICROSCOPIC-ADD ON - Abnormal; Notable for the following:    Bacteria, UA MANY (*)    All other components within normal limits  URINE CULTURE  CBC WITH DIFFERENTIAL  I-STAT TROPOININ, ED    Imaging Review Ct Head Wo Contrast  08/03/2013   CLINICAL DATA:  Fall. Laceration to bridge of nose, progressive confusion.  EXAM: CT HEAD WITHOUT CONTRAST  CT MAXILLOFACIAL WITHOUT CONTRAST  CT CERVICAL SPINE WITHOUT CONTRAST  TECHNIQUE: Multidetector CT imaging of the head, cervical spine, and maxillofacial structures were performed using the standard protocol without intravenous contrast. Multiplanar CT image reconstructions of the cervical spine and maxillofacial structures were also generated.  COMPARISON:  None.  FINDINGS: CT HEAD FINDINGS  There is mild diffuse low-attenuation within the subcortical and periventricular white matter compatible with chronic microvascular disease. No acute cortical infarct, hemorrhage, or mass lesion ispresent. Ventricles are of normal size. No significant  extra-axial fluid collection is present. The paranasal sinuses andmastoid air cells are clear. The osseous skull is intact.  CT MAXILLOFACIAL FINDINGS  Previous hardware fixation of the mandible. Chronic fracture involving the right zygomatic arch is noted. Mild mucosal thickening involves the maxillary sinuses and frontal sinus.  CT CERVICAL SPINE FINDINGS  Normal alignment of the cervical spine. The vertebral body heights are well preserved. The prevertebral soft tissue space is normal. Bilateral facet hypertrophy and degenerative change noted. No fractures or subluxations. Carotid artery calcifications noted. 7 mm nodule within the right upper lobe is noted. This is unchanged from 5/3/ 14 and 11/08/2010 compatible with a benign finding.  IMPRESSION: 1. No acute intracranial abnormalities. 2. Small  vessel ischemic change and brain atrophy. 3. Previous hardware fixation of the mandible. 4. No acute cervical spine findings.   Electronically Signed   By: Signa Kell M.D.   On: 08/03/2013 21:03   Ct Cervical Spine Wo Contrast  08/03/2013   CLINICAL DATA:  Fall. Laceration to bridge of nose, progressive confusion.  EXAM: CT HEAD WITHOUT CONTRAST  CT MAXILLOFACIAL WITHOUT CONTRAST  CT CERVICAL SPINE WITHOUT CONTRAST  TECHNIQUE: Multidetector CT imaging of the head, cervical spine, and maxillofacial structures were performed using the standard protocol without intravenous contrast. Multiplanar CT image reconstructions of the cervical spine and maxillofacial structures were also generated.  COMPARISON:  None.  FINDINGS: CT HEAD FINDINGS  There is mild diffuse low-attenuation within the subcortical and periventricular white matter compatible with chronic microvascular disease. No acute cortical infarct, hemorrhage, or mass lesion ispresent. Ventricles are of normal size. No significant extra-axial fluid collection is present. The paranasal sinuses andmastoid air cells are clear. The osseous skull is intact.  CT  MAXILLOFACIAL FINDINGS  Previous hardware fixation of the mandible. Chronic fracture involving the right zygomatic arch is noted. Mild mucosal thickening involves the maxillary sinuses and frontal sinus.  CT CERVICAL SPINE FINDINGS  Normal alignment of the cervical spine. The vertebral body heights are well preserved. The prevertebral soft tissue space is normal. Bilateral facet hypertrophy and degenerative change noted. No fractures or subluxations. Carotid artery calcifications noted. 7 mm nodule within the right upper lobe is noted. This is unchanged from 5/3/ 14 and 11/08/2010 compatible with a benign finding.  IMPRESSION: 1. No acute intracranial abnormalities. 2. Small vessel ischemic change and brain atrophy. 3. Previous hardware fixation of the mandible. 4. No acute cervical spine findings.   Electronically Signed   By: Signa Kell M.D.   On: 08/03/2013 21:03   Ct Maxillofacial Wo Cm  08/03/2013   CLINICAL DATA:  Fall. Laceration to bridge of nose, progressive confusion.  EXAM: CT HEAD WITHOUT CONTRAST  CT MAXILLOFACIAL WITHOUT CONTRAST  CT CERVICAL SPINE WITHOUT CONTRAST  TECHNIQUE: Multidetector CT imaging of the head, cervical spine, and maxillofacial structures were performed using the standard protocol without intravenous contrast. Multiplanar CT image reconstructions of the cervical spine and maxillofacial structures were also generated.  COMPARISON:  None.  FINDINGS: CT HEAD FINDINGS  There is mild diffuse low-attenuation within the subcortical and periventricular white matter compatible with chronic microvascular disease. No acute cortical infarct, hemorrhage, or mass lesion ispresent. Ventricles are of normal size. No significant extra-axial fluid collection is present. The paranasal sinuses andmastoid air cells are clear. The osseous skull is intact.  CT MAXILLOFACIAL FINDINGS  Previous hardware fixation of the mandible. Chronic fracture involving the right zygomatic arch is noted. Mild  mucosal thickening involves the maxillary sinuses and frontal sinus.  CT CERVICAL SPINE FINDINGS  Normal alignment of the cervical spine. The vertebral body heights are well preserved. The prevertebral soft tissue space is normal. Bilateral facet hypertrophy and degenerative change noted. No fractures or subluxations. Carotid artery calcifications noted. 7 mm nodule within the right upper lobe is noted. This is unchanged from 5/3/ 14 and 11/08/2010 compatible with a benign finding.  IMPRESSION: 1. No acute intracranial abnormalities. 2. Small vessel ischemic change and brain atrophy. 3. Previous hardware fixation of the mandible. 4. No acute cervical spine findings.   Electronically Signed   By: Signa Kell M.D.   On: 08/03/2013 21:03     EKG Interpretation   Date/Time:  Thursday August 03 2013 22:32:26 EDT  Ventricular Rate:  118 PR Interval:  139 QRS Duration: 100 QT Interval:  344 QTC Calculation: 482 R Axis:   -20 Text Interpretation:  Ectopic atrial tachycardia, unifocal Borderline left  axis deviation Abnormal R-wave progression, early transition Borderline  prolonged QT interval rate faster since previous  Confirmed by Alessio Bogan  MD,  Knoah Nedeau ([id number]) on 08/03/2013 10:37:00 PM      MDM   Final diagnoses:  None    Robet LeuGeneva F Payment is a 80 y.o. female here with frequent falls with new bruise on the face. Will do CT. She is in hospice.   10:30 PM Daughter came and told me that her behavior today is unusual. She had UTI previously. Will get labs, UA, EKG.   11:27 PM UA + UTI. Previous culture showed enterococcus. Will give ceftriaxone. Tachy. Will admit. CT showed no fractures or bleed.      Richardean Canalavid H Orhan Mayorga, MD 08/03/13 [phone]

## 2013-08-03 NOTE — Progress Notes (Signed)
CSW met with the patient and daughter at bedside to complete this assessment.  CSW attempted to speak with the patient but she only described some children that were looking at her.  CSW spoke with the daughter who reports that the patient been a resident of [facility] for almost 3 years. In the last year, she became a total dependent patient and declining dementia.  She reports that patient had not ambulated in a couple months but per the [place] its been 1 year.  Today, the daughter and the staff at the facility say that she been agitated and attempting to get out of the wheelchair/recliner to walk.  The daughter is concerned that this unusual behavior might be because of a UTI but at this time the labs are not complete.  Daughter name is Carmie Kanner is the POA and only child.  The patient is also being seen 3 days a week by [facility].    CSW shared concerns with Dr. Darl Householder and he is putting in orders for more labs to rule out other prosibilities.      Chesley Noon, MSW, [place], 08/03/2013 Evening Clinical Social Worker [phone]

## 2013-08-04 DIAGNOSIS — N390 Urinary tract infection, site not specified: Secondary | ICD-10-CM | POA: Diagnosis present

## 2013-08-04 DIAGNOSIS — H547 Unspecified visual loss: Secondary | ICD-10-CM

## 2013-08-04 LAB — I-STAT CG4 LACTIC ACID, ED: LACTIC ACID, VENOUS: 0.73 mmol/L (ref 0.5–2.2)

## 2013-08-04 MED ORDER — TIMOLOL MALEATE 0.5 % OP SOLN
1.0000 [drp] | Freq: Two times a day (BID) | OPHTHALMIC | Status: DC
  Administered 2013-08-04: 1 [drp] via OPHTHALMIC
  Filled 2013-08-04: qty 5

## 2013-08-04 MED ORDER — SODIUM CHLORIDE 0.9 % IJ SOLN: 3.0000 mL | Freq: Two times a day (BID) | INTRAMUSCULAR | Status: DC

## 2013-08-04 MED ORDER — LORAZEPAM 0.5 MG PO TABS: 0.5000 mg | ORAL_TABLET | Freq: Three times a day (TID) | ORAL | Status: DC | PRN

## 2013-08-04 MED ORDER — SENNOSIDES-DOCUSATE SODIUM 8.6-50 MG PO TABS
1.0000 | ORAL_TABLET | Freq: Every day | ORAL | Status: DC
  Filled 2013-08-04: qty 1

## 2013-08-04 MED ORDER — ASPIRIN EC 81 MG PO TBEC
81.0000 mg | DELAYED_RELEASE_TABLET | Freq: Every morning | ORAL | Status: DC
  Administered 2013-08-04: 81 mg via ORAL
  Filled 2013-08-04: qty 1

## 2013-08-04 MED ORDER — ZOLPIDEM TARTRATE 5 MG PO TABS: 5.0000 mg | ORAL_TABLET | Freq: Every day | ORAL | Status: DC

## 2013-08-04 MED ORDER — MORPHINE SULFATE 2 MG/ML IJ SOLN: 1.0000 mg | INTRAMUSCULAR | Status: DC | PRN

## 2013-08-04 MED ORDER — ONDANSETRON HCL 4 MG PO TABS: 4.0000 mg | ORAL_TABLET | Freq: Four times a day (QID) | ORAL | Status: DC | PRN

## 2013-08-04 MED ORDER — ESCITALOPRAM OXALATE 5 MG PO TABS
5.0000 mg | ORAL_TABLET | Freq: Every morning | ORAL | Status: DC
  Administered 2013-08-04: 5 mg via ORAL
  Filled 2013-08-04: qty 1

## 2013-08-04 MED ORDER — SODIUM CHLORIDE 0.9 % IJ SOLN: 3.0000 mL | INTRAMUSCULAR | Status: DC | PRN

## 2013-08-04 MED ORDER — ACETAMINOPHEN 500 MG PO TABS: 500.0000 mg | ORAL_TABLET | Freq: Four times a day (QID) | ORAL | Status: DC | PRN

## 2013-08-04 MED ORDER — LEVOTHYROXINE SODIUM 100 MCG PO TABS
100.0000 ug | ORAL_TABLET | Freq: Every day | ORAL | Status: DC
  Administered 2013-08-04: 100 ug via ORAL
  Filled 2013-08-04 (×2): qty 1

## 2013-08-04 MED ORDER — PILOCARPINE HCL 1 % OP SOLN
1.0000 [drp] | Freq: Four times a day (QID) | OPHTHALMIC | Status: DC
  Administered 2013-08-04: 1 [drp] via OPHTHALMIC
  Filled 2013-08-04: qty 15

## 2013-08-04 MED ORDER — SODIUM CHLORIDE 0.9 % IV SOLN: 250.0000 mL | INTRAVENOUS | Status: DC | PRN

## 2013-08-04 MED ORDER — ONDANSETRON HCL 4 MG/2ML IJ SOLN: 4.0000 mg | Freq: Four times a day (QID) | INTRAMUSCULAR | Status: DC | PRN

## 2013-08-04 NOTE — Progress Notes (Addendum)
Inpatient RN visit- Blenda NicelyGeneva Dere[facility]14[facility] RN Visit-Karen Merilynn FinlandRobertson RN  [facility] of Alzheimer's Dz. Pt is DNR code.  Pt seen at bedside, lying in bed with eyes closed, bruising noted around both eyes and small laceration to nose bridge. Pt awakened to name and gentle touch, did not open eyes. Breakfast tray at bedside, Clinical research associatewriter confirmed with staff that it was all ok for writer to attempt to feed pt. Pt asked if she wanted food, replied yes. HOB raised to upright position with assistance of staff RN.   Pt took 2 very small bites of oatmeal and 2 small spoonfuls of apple juice and refused more. Pt returned to lying position with bed in low position at end of visit. Staff tech made aware of bites taken. No family present at time of visit. [facility] SW Albin Fellingnne Batten has been in contact with pt's daughter Marcene BrawnDebbi, who confirmed that she would like for her [facility]. Writer spoke with staff CSW LowellHolly, patient to transfer back to [facility] via non emergent transport. Pt will need [facility] DNR for [facility], [facility].  Patient's home medication list and transfer summary in place on shadow chart.   Please call [facility] @ [phone]-  with any hospice needs.   Thank you. Hansel StarlingKaren E. Robertson, RN  [facility]  Hospice Liaison  [phone])

## 2013-08-04 NOTE — Discharge Summary (Signed)
Physician Discharge Summary  Diana [name] ZOX:[id number] DOB: Jul 25, 1925 DOA: 08/03/2013  PCP: Florentina Jenny, MD  Admit date: 08/03/2013 Discharge date: 08/04/2013  Time spent: 45 minutes  Recommendations for Outpatient Follow-up:  -Will be discharged back to facility today. -Per daughter, patient is under hospice care and they are not interested in treating any medical conditions or changing facilities.   Discharge Diagnoses:  Principal Problem:   UTI (urinary tract infection) Active Problems:   H/O: CVA (cerebrovascular accident)   Hypertension   Dementia   Fall at nursing home   UTI (lower urinary tract infection)   Discharge Condition: Stable  Filed Weights   08/04/13 0143  Weight: 48.58 kg (107 lb 1.6 oz)    History of present illness:  80 yo female with advanced dementia, htn, prev cva bedbound state who lives at snf with hospice comes in with daughter for severe agitation today. dtr says she only gets this way when she gets a bladder infection. No cough. No n/v/d. dtr very worried that she will be in pain at the snf. Does not wish for her mom to get ivf or any antibiotics. She knows her mom would not want anything to prolong her life at this stage of her dementia including ivf and abx. Daughter would like to speak to someone about a possible hospice house. Pt got very agitated today, fell while trying to get out of bed and has bruising to her face now.      Hospital Course:   Advanced Dementia Fall with Facial Bruising UTI Prior CVA  Discussed extensively with daughter Eunice Blase via phone. Patient is under hospice care. She does not want any further treatment including fluids or antibiotics. She is very comfortable with the facility where her mother is currently staying at and has no interest in changing. See no reason to prolong hospitalization at this point.  Procedures:  None   Consultations:  None  Discharge Instructions  Discharge Instructions   Discontinue IV    Complete by:  As directed      Increase activity slowly    Complete by:  As directed             Medication List    STOP taking these medications       aspirin EC 81 MG tablet      TAKE these medications       acetaminophen 500 MG tablet  Commonly known as:  TYLENOL  Take 500 mg by mouth every 6 (six) hours as needed for mild pain.     azelastine 0.05 % ophthalmic solution  Commonly known as:  OPTIVAR  Place 1 drop into both eyes 2 (two) times daily.     escitalopram 5 MG tablet  Commonly known as:  LEXAPRO  Take 5 mg by mouth every morning.     eucerin cream  Apply 1 application topically 2 (two) times daily. To lower extremities     levothyroxine 100 MCG tablet  Commonly known as:  SYNTHROID, LEVOTHROID  Take 1 tablet (100 mcg total) by mouth daily before breakfast.     LORazepam 0.5 MG tablet  Commonly known as:  ATIVAN  Take 0.5 mg by mouth every 8 (eight) hours as needed for anxiety.     pilocarpine 1 % ophthalmic solution  Commonly known as:  PILOCAR  Place 1 drop into both eyes 4 (four) times daily.     SELAN EX  Apply 1 application topically as needed (dry skin).  senna-docusate 8.6-50 MG per tablet  Commonly known as:  Senokot-S  Take 1 tablet by mouth at bedtime.     timolol 0.5 % ophthalmic solution  Commonly known as:  TIMOPTIC  Place 1 drop into both eyes 2 (two) times daily.     traMADol 50 MG tablet  Commonly known as:  ULTRAM  Take 50 mg by mouth 2 (two) times daily.     zolpidem 5 MG tablet  Commonly known as:  AMBIEN  Take 5 mg by mouth at bedtime.       Allergies  Allergen Reactions  . Codeine Other (See Comments)    Per MAR  . Latex Other (See Comments)    Per MAR  . Macrolides And Ketolides Other (See Comments)    Per MAR  . Nitrofurantoin Other (See Comments)    Per MAR  . Oxycodone Other (See Comments)    Per MAR  . Sulfa Antibiotics Other (See Comments)    Per MAR  . Trimethoprim Other (See  Comments)    Per MAR      The results of significant diagnostics from this hospitalization (including imaging, microbiology, ancillary and laboratory) are listed below for reference.    Significant Diagnostic Studies: Dg Chest 1 View  08/03/2013   CLINICAL DATA:  Altered mental status  EXAM: CHEST - 1 VIEW  COMPARISON:  06/08/2012  FINDINGS: No cardiomegaly. Mediastinal contours are likely stable when accounting for distortion from rightward rotation.  There is chronic elevation of the left diaphragm. No evidence of edema, effusion, consolidation, or pneumothorax.  IMPRESSION: No active disease.   Electronically Signed   By: Tiburcio PeaJonathan  Watts M.D.   On: 08/03/2013 23:40   Ct Head Wo Contrast  08/03/2013   CLINICAL DATA:  Fall. Laceration to bridge of nose, progressive confusion.  EXAM: CT HEAD WITHOUT CONTRAST  CT MAXILLOFACIAL WITHOUT CONTRAST  CT CERVICAL SPINE WITHOUT CONTRAST  TECHNIQUE: Multidetector CT imaging of the head, cervical spine, and maxillofacial structures were performed using the standard protocol without intravenous contrast. Multiplanar CT image reconstructions of the cervical spine and maxillofacial structures were also generated.  COMPARISON:  None.  FINDINGS: CT HEAD FINDINGS  There is mild diffuse low-attenuation within the subcortical and periventricular white matter compatible with chronic microvascular disease. No acute cortical infarct, hemorrhage, or mass lesion ispresent. Ventricles are of normal size. No significant extra-axial fluid collection is present. The paranasal sinuses andmastoid air cells are clear. The osseous skull is intact.  CT MAXILLOFACIAL FINDINGS  Previous hardware fixation of the mandible. Chronic fracture involving the right zygomatic arch is noted. Mild mucosal thickening involves the maxillary sinuses and frontal sinus.  CT CERVICAL SPINE FINDINGS  Normal alignment of the cervical spine. The vertebral body heights are well preserved. The prevertebral soft  tissue space is normal. Bilateral facet hypertrophy and degenerative change noted. No fractures or subluxations. Carotid artery calcifications noted. 7 mm nodule within the right upper lobe is noted. This is unchanged from 5/3/ 14 and 11/08/2010 compatible with a benign finding.  IMPRESSION: 1. No acute intracranial abnormalities. 2. Small vessel ischemic change and brain atrophy. 3. Previous hardware fixation of the mandible. 4. No acute cervical spine findings.   Electronically Signed   By: Signa Kellaylor  Stroud M.D.   On: 08/03/2013 21:03   Ct Cervical Spine Wo Contrast  08/03/2013   CLINICAL DATA:  Fall. Laceration to bridge of nose, progressive confusion.  EXAM: CT HEAD WITHOUT CONTRAST  CT MAXILLOFACIAL WITHOUT CONTRAST  CT CERVICAL  SPINE WITHOUT CONTRAST  TECHNIQUE: Multidetector CT imaging of the head, cervical spine, and maxillofacial structures were performed using the standard protocol without intravenous contrast. Multiplanar CT image reconstructions of the cervical spine and maxillofacial structures were also generated.  COMPARISON:  None.  FINDINGS: CT HEAD FINDINGS  There is mild diffuse low-attenuation within the subcortical and periventricular white matter compatible with chronic microvascular disease. No acute cortical infarct, hemorrhage, or mass lesion ispresent. Ventricles are of normal size. No significant extra-axial fluid collection is present. The paranasal sinuses andmastoid air cells are clear. The osseous skull is intact.  CT MAXILLOFACIAL FINDINGS  Previous hardware fixation of the mandible. Chronic fracture involving the right zygomatic arch is noted. Mild mucosal thickening involves the maxillary sinuses and frontal sinus.  CT CERVICAL SPINE FINDINGS  Normal alignment of the cervical spine. The vertebral body heights are well preserved. The prevertebral soft tissue space is normal. Bilateral facet hypertrophy and degenerative change noted. No fractures or subluxations. Carotid artery  calcifications noted. 7 mm nodule within the right upper lobe is noted. This is unchanged from 5/3/ 14 and 11/08/2010 compatible with a benign finding.  IMPRESSION: 1. No acute intracranial abnormalities. 2. Small vessel ischemic change and brain atrophy. 3. Previous hardware fixation of the mandible. 4. No acute cervical spine findings.   Electronically Signed   By: Signa Kellaylor  Stroud M.D.   On: 08/03/2013 21:03   Ct Maxillofacial Wo Cm  08/03/2013   CLINICAL DATA:  Fall. Laceration to bridge of nose, progressive confusion.  EXAM: CT HEAD WITHOUT CONTRAST  CT MAXILLOFACIAL WITHOUT CONTRAST  CT CERVICAL SPINE WITHOUT CONTRAST  TECHNIQUE: Multidetector CT imaging of the head, cervical spine, and maxillofacial structures were performed using the standard protocol without intravenous contrast. Multiplanar CT image reconstructions of the cervical spine and maxillofacial structures were also generated.  COMPARISON:  None.  FINDINGS: CT HEAD FINDINGS  There is mild diffuse low-attenuation within the subcortical and periventricular white matter compatible with chronic microvascular disease. No acute cortical infarct, hemorrhage, or mass lesion ispresent. Ventricles are of normal size. No significant extra-axial fluid collection is present. The paranasal sinuses andmastoid air cells are clear. The osseous skull is intact.  CT MAXILLOFACIAL FINDINGS  Previous hardware fixation of the mandible. Chronic fracture involving the right zygomatic arch is noted. Mild mucosal thickening involves the maxillary sinuses and frontal sinus.  CT CERVICAL SPINE FINDINGS  Normal alignment of the cervical spine. The vertebral body heights are well preserved. The prevertebral soft tissue space is normal. Bilateral facet hypertrophy and degenerative change noted. No fractures or subluxations. Carotid artery calcifications noted. 7 mm nodule within the right upper lobe is noted. This is unchanged from 5/3/ 14 and 11/08/2010 compatible with a  benign finding.  IMPRESSION: 1. No acute intracranial abnormalities. 2. Small vessel ischemic change and brain atrophy. 3. Previous hardware fixation of the mandible. 4. No acute cervical spine findings.   Electronically Signed   By: Signa Kellaylor  Stroud M.D.   On: 08/03/2013 21:03    Microbiology: No results found for this or any previous visit (from the past 240 hour(s)).   Labs: Basic Metabolic Panel:  Recent Labs Lab 08/03/13 2219  NA 138  K 4.7  CL 98  CO2 28  GLUCOSE 115*  BUN 31*  CREATININE 0.69  CALCIUM 10.1   Liver Function Tests: No results found for this basename: AST, ALT, ALKPHOS, BILITOT, PROT, ALBUMIN,  in the last 168 hours No results found for this basename: LIPASE, AMYLASE,  in  the last 168 hours No results found for this basename: AMMONIA,  in the last 168 hours CBC:  Recent Labs Lab 08/03/13 2219  WBC 10.2  NEUTROABS 7.6  HGB 13.7  HCT 39.3  MCV 96.8  PLT 263   Cardiac Enzymes: No results found for this basename: CKTOTAL, CKMB, CKMBINDEX, TROPONINI,  in the last 168 hours BNP: BNP (last 3 results) No results found for this basename: PROBNP,  in the last 8760 hours CBG: No results found for this basename: GLUCAP,  in the last 168 hours     Signed:  Chaya Jan  [facility] Pager: [phone] 08/04/2013, 10:27 AM

## 2013-08-04 NOTE — Progress Notes (Signed)
Clinical Social Work Department BRIEF PSYCHOSOCIAL ASSESSMENT 08/04/2013  Patient:  Robet LeuHARRISON,Dawnn F     Account Number:  [id number]     Admit date:  08/03/2013  Clinical Social Worker:  Dennison BullaGERBER,Julyssa Kyer, LCSW  Date/Time:  08/04/2013 11:45 AM  Referred by:  Physician  Date Referred:  08/04/2013 Referred for  ALF Placement   Other Referral:   Interview type:  Family Other interview type:    PSYCHOSOCIAL DATA Living Status:  FACILITY Admitted from facility:  [facility] Level of care:  Assisted Living Primary support name:  Debbi Primary support relationship to patient:  CHILD, ADULT Degree of support available:   Strong    CURRENT CONCERNS Current Concerns  Post-Acute Placement   Other Concerns:    SOCIAL WORK ASSESSMENT / PLAN CSW received  referral due to patient being admitted from a facility. CSW reviewed chart and went to patient's room but no family present and patient unable to participate in assessment. CSW called and spoke with dtr via phone.    Patient is from [facility] with hospice following. Hospice SW spoke with dtr as well and confirmed plans for patient to return to ALF. CSW faxed DC summary and ALF to [facility] and spoke with [place]. ALF confirms they are ready to accept patient and no hard script for Ativan is needed. CSW prepared DC packet with DC summary, DNR and FL2 included. Dtr reports she is concerned about bed alarm. CSW spoke with hospice RN who confirms hospice SW is working on getting bed alarm for ALF. Hospice is aware of DC today.    CSW spoke with dtr who requested [facility] transport patient. Dtr is aware of no guarantee of payment and requests that she is contacted when [facility] arrives to transport patient. CSW asked NS to call dtr and she is agreeable. Dtr has CSW contact information if needed.    [facility] request #: [id number].    CSW is signing off but available if needed.   Assessment/plan status:  No Further Intervention  Required Other assessment/ plan:   Information/referral to community resources:   Will return to ALF    PATIENT'S/FAMILY'S RESPONSE TO PLAN OF CARE: Patient unable to participate in assessment. Dtr thanked CSW for information and phone call. Dtr reports she is tired from staying up all night with patient and is trying to get rest. Dtr is happy that patient is returning to ALF because she knows they provide good care for patient and know what she needs. Dtr reports no further questions but thanked CSW for call.       [place]burg[place]olly Leliana Kontz, Kentucky[place]CS[place] [phone]

## 2013-08-05 LAB — URINE CULTURE

## 2013-09-09 ENCOUNTER — Emergency Department (HOSPITAL_COMMUNITY)

## 2013-09-09 ENCOUNTER — Emergency Department (HOSPITAL_COMMUNITY)
Admission: EM | Admit: 2013-09-09 | Discharge: 2013-09-10 | Disposition: A | Discharge status: Home / Self Care | Attending: Emergency Medicine | Admitting: Emergency Medicine

## 2013-09-09 DIAGNOSIS — IMO0002 Reserved for concepts with insufficient information to code with codable children: Secondary | ICD-10-CM

## 2013-09-09 DIAGNOSIS — F411 Generalized anxiety disorder: Secondary | ICD-10-CM | POA: Insufficient documentation

## 2013-09-09 DIAGNOSIS — E039 Hypothyroidism, unspecified: Secondary | ICD-10-CM | POA: Insufficient documentation

## 2013-09-09 DIAGNOSIS — S60229A Contusion of unspecified hand, initial encounter: Secondary | ICD-10-CM | POA: Insufficient documentation

## 2013-09-09 DIAGNOSIS — S0180XA Unspecified open wound of other part of head, initial encounter: Secondary | ICD-10-CM | POA: Diagnosis not present

## 2013-09-09 DIAGNOSIS — S0993XA Unspecified injury of face, initial encounter: Secondary | ICD-10-CM | POA: Insufficient documentation

## 2013-09-09 DIAGNOSIS — F3289 Other specified depressive episodes: Secondary | ICD-10-CM | POA: Diagnosis not present

## 2013-09-09 DIAGNOSIS — S199XXA Unspecified injury of neck, initial encounter: Secondary | ICD-10-CM

## 2013-09-09 DIAGNOSIS — H543 Unqualified visual loss, both eyes: Secondary | ICD-10-CM | POA: Diagnosis not present

## 2013-09-09 DIAGNOSIS — Y9289 Other specified places as the place of occurrence of the external cause: Secondary | ICD-10-CM | POA: Insufficient documentation

## 2013-09-09 DIAGNOSIS — Z23 Encounter for immunization: Secondary | ICD-10-CM | POA: Insufficient documentation

## 2013-09-09 DIAGNOSIS — Z880 Allergy status to penicillin: Secondary | ICD-10-CM | POA: Insufficient documentation

## 2013-09-09 DIAGNOSIS — S0010XA Contusion of unspecified eyelid and periocular area, initial encounter: Secondary | ICD-10-CM | POA: Insufficient documentation

## 2013-09-09 DIAGNOSIS — Y9389 Activity, other specified: Secondary | ICD-10-CM | POA: Insufficient documentation

## 2013-09-09 DIAGNOSIS — Z8673 Personal history of transient ischemic attack (TIA), and cerebral infarction without residual deficits: Secondary | ICD-10-CM | POA: Diagnosis not present

## 2013-09-09 DIAGNOSIS — I10 Essential (primary) hypertension: Secondary | ICD-10-CM | POA: Diagnosis not present

## 2013-09-09 DIAGNOSIS — Z91040 Latex allergy status: Secondary | ICD-10-CM | POA: Insufficient documentation

## 2013-09-09 DIAGNOSIS — S40019A Contusion of unspecified shoulder, initial encounter: Secondary | ICD-10-CM | POA: Insufficient documentation

## 2013-09-09 DIAGNOSIS — S51009A Unspecified open wound of unspecified elbow, initial encounter: Secondary | ICD-10-CM | POA: Insufficient documentation

## 2013-09-09 DIAGNOSIS — R296 Repeated falls: Secondary | ICD-10-CM | POA: Diagnosis not present

## 2013-09-09 DIAGNOSIS — S40012A Contusion of left shoulder, initial encounter: Secondary | ICD-10-CM

## 2013-09-09 DIAGNOSIS — F0390 Unspecified dementia without behavioral disturbance: Secondary | ICD-10-CM | POA: Insufficient documentation

## 2013-09-09 DIAGNOSIS — S0181XA Laceration without foreign body of other part of head, initial encounter: Secondary | ICD-10-CM

## 2013-09-09 DIAGNOSIS — S6992XA Unspecified injury of left wrist, hand and finger(s), initial encounter: Secondary | ICD-10-CM

## 2013-09-09 DIAGNOSIS — Z79899 Other long term (current) drug therapy: Secondary | ICD-10-CM | POA: Diagnosis not present

## 2013-09-09 DIAGNOSIS — S0083XA Contusion of other part of head, initial encounter: Secondary | ICD-10-CM

## 2013-09-09 DIAGNOSIS — G4700 Insomnia, unspecified: Secondary | ICD-10-CM | POA: Diagnosis not present

## 2013-09-09 DIAGNOSIS — F329 Major depressive disorder, single episode, unspecified: Secondary | ICD-10-CM | POA: Diagnosis not present

## 2013-09-09 LAB — CBC WITH DIFFERENTIAL/PLATELET
BASOS PCT: 0 % (ref 0–1)
Basophils Absolute: 0 10*3/uL (ref 0.0–0.1)
EOS ABS: 0.1 10*3/uL (ref 0.0–0.7)
Eosinophils Relative: 1 % (ref 0–5)
HCT: 34.9 % — ABNORMAL LOW (ref 36.0–46.0)
Hemoglobin: 11.6 g/dL — ABNORMAL LOW (ref 12.0–15.0)
Lymphocytes Relative: 9 % — ABNORMAL LOW (ref 12–46)
Lymphs Abs: 1.2 10*3/uL (ref 0.7–4.0)
MCH: 31.9 pg (ref 26.0–34.0)
MCHC: 33.2 g/dL (ref 30.0–36.0)
MCV: 95.9 fL (ref 78.0–100.0)
Monocytes Absolute: 0.7 10*3/uL (ref 0.1–1.0)
Monocytes Relative: 6 % (ref 3–12)
NEUTROS ABS: 11.1 10*3/uL — AB (ref 1.7–7.7)
Neutrophils Relative %: 84 % — ABNORMAL HIGH (ref 43–77)
PLATELETS: 362 10*3/uL (ref 150–400)
RBC: 3.64 MIL/uL — ABNORMAL LOW (ref 3.87–5.11)
RDW: 12.8 % (ref 11.5–15.5)
WBC: 13.3 10*3/uL — ABNORMAL HIGH (ref 4.0–10.5)

## 2013-09-09 LAB — COMPREHENSIVE METABOLIC PANEL
ALBUMIN: 3.3 g/dL — AB (ref 3.5–5.2)
ALK PHOS: 71 U/L (ref 39–117)
ALT: 11 U/L (ref 0–35)
ANION GAP: 15 (ref 5–15)
AST: 16 U/L (ref 0–37)
BUN: 32 mg/dL — AB (ref 6–23)
CO2: 23 mEq/L (ref 19–32)
Calcium: 8.7 mg/dL (ref 8.4–10.5)
Chloride: 95 mEq/L — ABNORMAL LOW (ref 96–112)
Creatinine, Ser: 0.63 mg/dL (ref 0.50–1.10)
GFR calc Af Amer: >90 mL/min (ref >90)
GFR calc non Af Amer: 80 mL/min — ABNORMAL LOW (ref >90)
Glucose, Bld: 110 mg/dL — ABNORMAL HIGH (ref 70–99)
Potassium: 4.5 mEq/L (ref 3.7–5.3)
Sodium: 133 mEq/L — ABNORMAL LOW (ref 137–147)
TOTAL PROTEIN: 7.2 g/dL (ref 6.0–8.3)
Total Bilirubin: <0.2 mg/dL — ABNORMAL LOW (ref 0.3–1.2)

## 2013-09-09 MED ORDER — FENTANYL CITRATE 0.05 MG/ML IJ SOLN
50.0000 ug | Freq: Once | INTRAMUSCULAR | Status: AC
  Administered 2013-09-09: 50 ug via INTRAVENOUS
  Filled 2013-09-09: qty 2

## 2013-09-09 MED ORDER — SODIUM CHLORIDE 0.9 % IV BOLUS (SEPSIS)
500.0000 mL | Freq: Once | INTRAVENOUS | Status: AC
  Administered 2013-09-09: 500 mL via INTRAVENOUS

## 2013-09-09 MED ORDER — TETANUS-DIPHTH-ACELL PERTUSSIS 5-2.5-18.5 LF-MCG/0.5 IM SUSP
0.5000 mL | Freq: Once | INTRAMUSCULAR | Status: AC
  Administered 2013-09-09: 0.5 mL via INTRAMUSCULAR
  Filled 2013-09-09: qty 0.5

## 2013-09-09 NOTE — ED Notes (Signed)
Patient not on unit to assess pain.

## 2013-09-09 NOTE — ED Notes (Signed)
Discussed case with Dr. Romeo AppleHarrison.

## 2013-09-09 NOTE — ED Notes (Signed)
Shanda BumpsJessica, hospice RN called to follow up on patient status.  She would like to be called back when patient is sent home or admitted at [phone].

## 2013-09-09 NOTE — ED Notes (Signed)
I& D cart at the bedside.  

## 2013-09-09 NOTE — ED Provider Notes (Signed)
CSN: [id number]     Arrival date & time 09/09/13  2043 History   First MD Initiated Contact with Patient 09/09/13 2050     Chief Complaint  Patient presents with  . Fall     (Consider location/radiation/quality/duration/timing/severity/associated sxs/prior Treatment) Patient is a 80 y.o. female presenting with fall. The history is provided by the EMS personnel (daughter).  Fall This is a recurrent problem. The current episode started 1 to 2 hours ago. Episode frequency: once. The problem has been resolved. Pertinent negatives include no chest pain, no abdominal pain, no headaches and no shortness of breath. Nothing aggravates the symptoms. Nothing relieves the symptoms. She has tried nothing for the symptoms. The treatment provided no relief.    Past Medical History  Diagnosis Date  . Thyroid disease   . Hypertension   . Blind   . Anxiety   . Depression   . CVA (cerebral vascular accident)   . Dementia   . Hypothyroid   . A-fib   . Insomnia    Past Surgical History  Procedure Laterality Date  . Appendectomy    . Abdominal hysterectomy    . Eye surgery     No family history on file. History  Substance Use Topics  . Smoking status: Never Smoker   . Smokeless tobacco: Not on file  . Alcohol Use: No   OB History   Grav Para Term Preterm Abortions TAB SAB Ect Mult Living                 Review of Systems  Constitutional: Negative for fever and fatigue.  HENT: Negative for congestion and drooling.   Eyes: Negative for pain.  Respiratory: Negative for cough and shortness of breath.   Cardiovascular: Negative for chest pain.  Gastrointestinal: Negative for nausea, vomiting, abdominal pain and diarrhea.  Genitourinary: Negative for dysuria and hematuria.  Musculoskeletal: Negative for back pain, gait problem and neck pain.  Skin: Negative for color change.  Neurological: Negative for dizziness and headaches.  Hematological: Negative for adenopathy.   Psychiatric/Behavioral: Negative for behavioral problems.  All other systems reviewed and are negative.     Allergies  Codeine; Latex; Macrolides and ketolides; Nitrofurantoin; Oxycodone; Sulfa antibiotics; and Trimethoprim  Home Medications   Prior to Admission medications   Medication Sig Start Date End Date Taking? Authorizing Provider  acetaminophen (TYLENOL) 500 MG tablet Take 500 mg by mouth every 6 (six) hours as needed for mild pain.     Historical Provider, MD  azelastine (OPTIVAR) 0.05 % ophthalmic solution Place 1 drop into both eyes 2 (two) times daily.    Historical Provider, MD  escitalopram (LEXAPRO) 5 MG tablet Take 5 mg by mouth every morning.    Historical Provider, MD  levothyroxine (SYNTHROID, LEVOTHROID) 100 MCG tablet Take 1 tablet (100 mcg total) by mouth daily before breakfast. 06/14/12   Sorin June Leap, MD  LORazepam (ATIVAN) 0.5 MG tablet Take 0.5 mg by mouth every 8 (eight) hours as needed for anxiety.    Historical Provider, MD  pilocarpine (PILOCAR) 1 % ophthalmic solution Place 1 drop into both eyes 4 (four) times daily.    Historical Provider, MD  senna-docusate (SENOKOT-S) 8.6-50 MG per tablet Take 1 tablet by mouth at bedtime. 06/14/12   Sorin June Leap, MD  Silicone-Lanolin [facility] EX) Apply 1 application topically as needed (dry skin).    Historical Provider, MD  Skin Protectants, Misc. (EUCERIN) cream Apply 1 application topically 2 (two) times daily. To lower extremities  Historical Provider, MD  timolol (TIMOPTIC) 0.5 % ophthalmic solution Place 1 drop into both eyes 2 (two) times daily.    Historical Provider, MD  traMADol (ULTRAM) 50 MG tablet Take 50 mg by mouth 2 (two) times daily.    Historical Provider, MD  zolpidem (AMBIEN) 5 MG tablet Take 5 mg by mouth at bedtime.    Historical Provider, MD   BP 136/88  Pulse 75  Temp(Src) 97.9 F (36.6 C) (Oral)  Resp 13  Ht '5\' 8"'  (1.727 m)  Wt 128 lb (58.06 kg)  BMI 19.47 kg/m2  SpO2 98% Physical Exam   Nursing note and vitals reviewed. Constitutional: She appears well-developed and well-nourished.  HENT:  Mouth/Throat: Oropharynx is clear and moist. No oropharyngeal exudate.  Moderate swelling and ecchymosis to the left periorbital area extending to the left maxilla and mildly to the left jaw.   No obvious eye injury on my exam. 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   3 cm superficial laceration to the left lateral eye brow.   Eyes: Conjunctivae and EOM are normal.  Neck: Normal range of motion. Neck supple.  Cardiovascular: Normal rate, regular rhythm, normal heart sounds and intact distal pulses.  Exam reveals no gallop and no friction rub.   No murmur heard. Pulmonary/Chest: Effort normal and breath sounds normal. No respiratory distress. She has no wheezes.  Abdominal: Soft. Bowel sounds are normal. There is no tenderness. There is no rebound and no guarding.  Musculoskeletal: Normal range of motion. She exhibits no edema and no tenderness.  Mild bruising to left anterior shoulder.   Mild bruising and small skin tear to left lateral elbow.   Mild bruising to dorsum of left hand.   Normal rom of hips bilaterally w/out pain.   Neurological: She is alert. She has normal strength. No sensory deficit.  Skin: Skin is warm and dry.  Psychiatric: She has a normal mood and affect. Her behavior is normal.    ED Course  LACERATION REPAIR Date/Time: 09/10/2013 11:59 AM Performed by: Pamella Pert Authorized by: Pamella Pert Consent: Verbal consent obtained. written consent not obtained. Risks and benefits: risks, benefits and alternatives were discussed Consent given by: guardian Required items: required blood products, implants, devices, and special equipment available Patient identity confirmed: verbally with patient, arm band, provided demographic data and hospital-assigned identification number Time out:  Immediately prior to procedure a "time out" was called to verify the correct patient, procedure, equipment, support staff and site/side marked as required. Body area: head/neck Location details: left eyebrow Laceration length: 3 cm Foreign bodies: no foreign bodies Tendon involvement: none Nerve involvement: none Vascular damage: no Anesthesia: local infiltration Local anesthetic: lidocaine 1% with epinephrine Anesthetic total: 3 ml Patient sedated: no Preparation: Patient was prepped and draped in the usual sterile fashion. Irrigation solution: saline Irrigation method: syringe Amount of cleaning: standard Debridement: none Degree of undermining: none Skin closure: 5-0 Prolene Number of sutures: 3 Technique: simple Approximation: close Approximation difficulty: simple Patient tolerance: Patient tolerated the procedure well with no immediate complications.   (including critical care time) Labs Review Labs Reviewed  CBC WITH DIFFERENTIAL - Abnormal; Notable for the following:    WBC 13.3 (*)    RBC 3.64 (*)    Hemoglobin 11.6 (*)    HCT 34.9 (*)    Neutrophils Relative % 84 (*)    Neutro Abs 11.1 (*)    Lymphocytes Relative  9 (*)    All other components within normal limits  COMPREHENSIVE METABOLIC PANEL - Abnormal; Notable for the following:    Sodium 133 (*)    Chloride 95 (*)    Glucose, Bld 110 (*)    BUN 32 (*)    Albumin 3.3 (*)    Total Bilirubin <0.2 (*)    GFR calc non Af Amer 80 (*)    All other components within normal limits    Imaging Review Dg Chest 2 View  09/09/2013   CLINICAL DATA:  Disoriented uncooperative fall  EXAM: CHEST  2 VIEW  COMPARISON:  08/03/2013  FINDINGS: Heart size and vascular pattern are normal. Uncoiling of the aorta. Lungs are clear. No effusions. Bony thorax grossly intact.  IMPRESSION: No acute finding   Electronically Signed   By: Skipper Cliche M.D.   On: 09/09/2013 23:17   Dg Pelvis 1-2 Views  09/09/2013   CLINICAL DATA:   Fall disoriented uncooperative  EXAM: PELVIS - 1-2 VIEW  COMPARISON:  11/08/2010.  FINDINGS: Significant fecal retention particularly in the rectum. Bowel contents obscure the sacrum and the iliac bones. Cortical irregularity and lucency of the inferior pubic ramus on the left. This appearance is stable from 11/08/2010.  IMPRESSION: No acute findings   Electronically Signed   By: Skipper Cliche M.D.   On: 09/09/2013 23:16   Dg Elbow Complete Left  09/09/2013   CLINICAL DATA:  [place] disoriented uncooperative  EXAM: LEFT ELBOW - COMPLETE 3+ VIEW  COMPARISON:  None.  FINDINGS: No fracture dislocation or effusion.  Olecranon enthesopathy noted.  IMPRESSION: No acute findings   Electronically Signed   By: Skipper Cliche M.D.   On: 09/09/2013 23:13   Ct Head Wo Contrast  09/10/2013   CLINICAL DATA:  Status post fall. Swelling and bruising about the left orbit and left side of the face. Concern for cervical spine injury.  EXAM: CT HEAD WITHOUT CONTRAST  CT MAXILLOFACIAL WITHOUT CONTRAST  CT CERVICAL SPINE WITHOUT CONTRAST  TECHNIQUE: Multidetector CT imaging of the head, cervical spine, and maxillofacial structures were performed using the standard protocol without intravenous contrast. Multiplanar CT image reconstructions of the cervical spine and maxillofacial structures were also generated.  COMPARISON:  CT of the head, cervical spine and maxillofacial structures performed 08/03/2013  FINDINGS: CT HEAD FINDINGS  There is no evidence of acute infarction, mass lesion, or intra- or extra-axial hemorrhage on CT.  Prominence of the ventricles and sulci reflects mild to moderate cortical volume loss. Cerebellar atrophy is noted. Scattered periventricular and subcortical white matter change likely reflects small vessel ischemic microangiopathy.  The brainstem and fourth ventricle are within normal limits. The basal ganglia are unremarkable in appearance. The cerebral hemispheres demonstrate grossly normal gray-white  differentiation. No mass effect or midline shift is seen.  There is no evidence of fracture; visualized osseous structures are unremarkable in appearance. The orbits are within normal limits. The paranasal sinuses and mastoid air cells are well-aerated. Soft tissue swelling is noted surrounding the left orbit, and overlying the left zygomatic arch. Cerumen is noted filling the external auditory canals bilaterally.  CT MAXILLOFACIAL FINDINGS  There is no evidence of fracture or dislocation. The maxilla and mandible appear intact. The nasal bone is unremarkable in appearance.  There is mild chronic apparent osseous expansion of the right side of the central mandible, with associated hardware. There is absence of multiple maxillary and mandibular teeth, with multiple large dental caries.  The orbits are intact bilaterally.  Minimal mucosal thickening is noted within the right frontal sinus. The remaining visualized paranasal sinuses and mastoid air cells are well-aerated.  There is soft tissue swelling at the left eyelids, with diffuse soft tissue swelling about the left orbit, and soft tissue swelling overlying the left maxilla and left mandible.  Postoperative changes are noted along the right anterior side of the neck. The parapharyngeal fat planes are preserved. The nasopharynx, oropharynx and hypopharynx are unremarkable in appearance. The visualized portions of the valleculae and piriform sinuses are grossly unremarkable.  The parotid and submandibular glands are within normal limits. No cervical lymphadenopathy is seen.  CT CERVICAL SPINE FINDINGS  There is no evidence of fracture or subluxation. Vertebral bodies demonstrate normal height and alignment. Intervertebral Mild multilevel disc space narrowing is noted along the mid cervical spine, with a few small posterior disc osteophyte complexes. Prevertebral soft tissues are within normal limits. The visualized neural foramina are grossly unremarkable.  The  thyroid gland is unremarkable in appearance. The visualized lung apices are clear. No significant soft tissue abnormalities are seen.  IMPRESSION: 1. No evidence of traumatic intracranial injury or fracture. 2. No evidence of fracture or dislocation with regard to the maxillofacial structures. 3. No evidence of fracture or subluxation along the cervical spine. 4. Soft tissue swelling surrounding the left orbit, and overlying the left zygomatic arch. Diffuse soft tissue swelling extends overlying the left maxilla and left mandible. 5. Mild nonspecific chronic osseous depression of the right side of the central mandible, with associated hardware. Absence of multiple maxillary and mandibular teeth, with multiple large dental caries. 6. Minimal mucosal thickening at the right frontal sinus. 7. Minimal degenerative change along the mid cervical spine. 8. Cerumen noted filling the external auditory canals bilaterally. 9. Mild to moderate cortical volume loss and scattered small vessel ischemic microangiopathy.   Electronically Signed   By: Garald Balding M.D.   On: 09/10/2013 00:01   Ct Cervical Spine Wo Contrast  09/10/2013   CLINICAL DATA:  Status post fall. Swelling and bruising about the left orbit and left side of the face. Concern for cervical spine injury.  EXAM: CT HEAD WITHOUT CONTRAST  CT MAXILLOFACIAL WITHOUT CONTRAST  CT CERVICAL SPINE WITHOUT CONTRAST  TECHNIQUE: Multidetector CT imaging of the head, cervical spine, and maxillofacial structures were performed using the standard protocol without intravenous contrast. Multiplanar CT image reconstructions of the cervical spine and maxillofacial structures were also generated.  COMPARISON:  CT of the head, cervical spine and maxillofacial structures performed 08/03/2013  FINDINGS: CT HEAD FINDINGS  There is no evidence of acute infarction, mass lesion, or intra- or extra-axial hemorrhage on CT.  Prominence of the ventricles and sulci reflects mild to moderate  cortical volume loss. Cerebellar atrophy is noted. Scattered periventricular and subcortical white matter change likely reflects small vessel ischemic microangiopathy.  The brainstem and fourth ventricle are within normal limits. The basal ganglia are unremarkable in appearance. The cerebral hemispheres demonstrate grossly normal gray-white differentiation. No mass effect or midline shift is seen.  There is no evidence of fracture; visualized osseous structures are unremarkable in appearance. The orbits are within normal limits. The paranasal sinuses and mastoid air cells are well-aerated. Soft tissue swelling is noted surrounding the left orbit, and overlying the left zygomatic arch. Cerumen is noted filling the external auditory canals bilaterally.  CT MAXILLOFACIAL FINDINGS  There is no evidence of fracture or dislocation. The maxilla and mandible appear intact. The nasal bone is unremarkable in appearance.  There  is mild chronic apparent osseous expansion of the right side of the central mandible, with associated hardware. There is absence of multiple maxillary and mandibular teeth, with multiple large dental caries.  The orbits are intact bilaterally. Minimal mucosal thickening is noted within the right frontal sinus. The remaining visualized paranasal sinuses and mastoid air cells are well-aerated.  There is soft tissue swelling at the left eyelids, with diffuse soft tissue swelling about the left orbit, and soft tissue swelling overlying the left maxilla and left mandible.  Postoperative changes are noted along the right anterior side of the neck. The parapharyngeal fat planes are preserved. The nasopharynx, oropharynx and hypopharynx are unremarkable in appearance. The visualized portions of the valleculae and piriform sinuses are grossly unremarkable.  The parotid and submandibular glands are within normal limits. No cervical lymphadenopathy is seen.  CT CERVICAL SPINE FINDINGS  There is no evidence of  fracture or subluxation. Vertebral bodies demonstrate normal height and alignment. Intervertebral Mild multilevel disc space narrowing is noted along the mid cervical spine, with a few small posterior disc osteophyte complexes. Prevertebral soft tissues are within normal limits. The visualized neural foramina are grossly unremarkable.  The thyroid gland is unremarkable in appearance. The visualized lung apices are clear. No significant soft tissue abnormalities are seen.  IMPRESSION: 1. No evidence of traumatic intracranial injury or fracture. 2. No evidence of fracture or dislocation with regard to the maxillofacial structures. 3. No evidence of fracture or subluxation along the cervical spine. 4. Soft tissue swelling surrounding the left orbit, and overlying the left zygomatic arch. Diffuse soft tissue swelling extends overlying the left maxilla and left mandible. 5. Mild nonspecific chronic osseous depression of the right side of the central mandible, with associated hardware. Absence of multiple maxillary and mandibular teeth, with multiple large dental caries. 6. Minimal mucosal thickening at the right frontal sinus. 7. Minimal degenerative change along the mid cervical spine. 8. Cerumen noted filling the external auditory canals bilaterally. 9. Mild to moderate cortical volume loss and scattered small vessel ischemic microangiopathy.   Electronically Signed   By: Garald Balding M.D.   On: 09/10/2013 00:01   Dg Shoulder Left  09/09/2013   CLINICAL DATA:  Patient is disoriented, fell, shoulder pain  EXAM: LEFT SHOULDER - 2+ VIEW  COMPARISON:  None.  FINDINGS: Suboptimal images due to limited patient ability to cooperate. No fracture involving the humerus is identified. The humeral head does appear to be in MI of the anterior and inferior position. This study is performed with the scapula in abnormal position.  IMPRESSION: Limited study as described above. Possible anterior dislocation. An axillary view would  be helpful. Cannot exclude winging of the scapula.   Electronically Signed   By: Skipper Cliche M.D.   On: 09/09/2013 23:13   Dg Hand Complete Left  09/09/2013   CLINICAL DATA:  Fall disoriented uncooperative  EXAM: LEFT HAND - COMPLETE 3+ VIEW  COMPARISON:  None.  FINDINGS: Study moderately limited by suboptimal positioning. Diffuse osteopenia. Moderate degenerative change first carpometacarpal joint. Wide separation of the scaphoid and lunate indicate scapholunate ligament tear of uncertain age.  IMPRESSION: Moderately limited study. No acute osseous findings to suggest fracture. Scapholunate ligament tear noted.   Electronically Signed   By: Skipper Cliche M.D.   On: 09/09/2013 23:15   Ct Maxillofacial Wo Cm  09/10/2013   CLINICAL DATA:  Status post fall. Swelling and bruising about the left orbit and left side of the face. Concern for cervical  spine injury.  EXAM: CT HEAD WITHOUT CONTRAST  CT MAXILLOFACIAL WITHOUT CONTRAST  CT CERVICAL SPINE WITHOUT CONTRAST  TECHNIQUE: Multidetector CT imaging of the head, cervical spine, and maxillofacial structures were performed using the standard protocol without intravenous contrast. Multiplanar CT image reconstructions of the cervical spine and maxillofacial structures were also generated.  COMPARISON:  CT of the head, cervical spine and maxillofacial structures performed 08/03/2013  FINDINGS: CT HEAD FINDINGS  There is no evidence of acute infarction, mass lesion, or intra- or extra-axial hemorrhage on CT.  Prominence of the ventricles and sulci reflects mild to moderate cortical volume loss. Cerebellar atrophy is noted. Scattered periventricular and subcortical white matter change likely reflects small vessel ischemic microangiopathy.  The brainstem and fourth ventricle are within normal limits. The basal ganglia are unremarkable in appearance. The cerebral hemispheres demonstrate grossly normal gray-white differentiation. No mass effect or midline shift is seen.   There is no evidence of fracture; visualized osseous structures are unremarkable in appearance. The orbits are within normal limits. The paranasal sinuses and mastoid air cells are well-aerated. Soft tissue swelling is noted surrounding the left orbit, and overlying the left zygomatic arch. Cerumen is noted filling the external auditory canals bilaterally.  CT MAXILLOFACIAL FINDINGS  There is no evidence of fracture or dislocation. The maxilla and mandible appear intact. The nasal bone is unremarkable in appearance.  There is mild chronic apparent osseous expansion of the right side of the central mandible, with associated hardware. There is absence of multiple maxillary and mandibular teeth, with multiple large dental caries.  The orbits are intact bilaterally. Minimal mucosal thickening is noted within the right frontal sinus. The remaining visualized paranasal sinuses and mastoid air cells are well-aerated.  There is soft tissue swelling at the left eyelids, with diffuse soft tissue swelling about the left orbit, and soft tissue swelling overlying the left maxilla and left mandible.  Postoperative changes are noted along the right anterior side of the neck. The parapharyngeal fat planes are preserved. The nasopharynx, oropharynx and hypopharynx are unremarkable in appearance. The visualized portions of the valleculae and piriform sinuses are grossly unremarkable.  The parotid and submandibular glands are within normal limits. No cervical lymphadenopathy is seen.  CT CERVICAL SPINE FINDINGS  There is no evidence of fracture or subluxation. Vertebral bodies demonstrate normal height and alignment. Intervertebral Mild multilevel disc space narrowing is noted along the mid cervical spine, with a few small posterior disc osteophyte complexes. Prevertebral soft tissues are within normal limits. The visualized neural foramina are grossly unremarkable.  The thyroid gland is unremarkable in appearance. The visualized lung  apices are clear. No significant soft tissue abnormalities are seen.  IMPRESSION: 1. No evidence of traumatic intracranial injury or fracture. 2. No evidence of fracture or dislocation with regard to the maxillofacial structures. 3. No evidence of fracture or subluxation along the cervical spine. 4. Soft tissue swelling surrounding the left orbit, and overlying the left zygomatic arch. Diffuse soft tissue swelling extends overlying the left maxilla and left mandible. 5. Mild nonspecific chronic osseous depression of the right side of the central mandible, with associated hardware. Absence of multiple maxillary and mandibular teeth, with multiple large dental caries. 6. Minimal mucosal thickening at the right frontal sinus. 7. Minimal degenerative change along the mid cervical spine. 8. Cerumen noted filling the external auditory canals bilaterally. 9. Mild to moderate cortical volume loss and scattered small vessel ischemic microangiopathy.   Electronically Signed   By: Francoise Schaumann.D.  On: 09/10/2013 00:01     EKG Interpretation   Date/Time:  Saturday September 09 2013 20:48:59 EDT Ventricular Rate:  76 PR Interval:  76 QRS Duration: 110 QT Interval:  404 QTC Calculation: 454 R Axis:   -6 Text Interpretation:  Sinus rhythm Short PR interval Right atrial  enlargement Incomplete left bundle branch block ST elevation suggests  acute pericarditis Artifact in lead(s) I II aVR aVL aVF V1 V4 V5 V6 No  significant change since last tracing Confirmed by Garriga  MD, Aariana Shankland  ([id number]) on 09/09/2013 9:07:34 PM      MDM   Final diagnoses:  Laceration of face, initial encounter  Contusion of face, initial encounter  Skin tear  Hand injuries, left, initial encounter  Contusion of left shoulder, initial encounter    9:08 PM 80 y.o. female w hx of HTN, CVA, afib who pw reported fall in the bathroom while trying to remove her diaper. AFVSS here. Obvious injury to left peri-orbital and left maxillary  area. Will get imaging, labs, pain control. Will update tdap.   Left shoulder film limited and radiologist reporting that he cannot r/o ant dislocation. There is no evidence of this on exam as I am able to range the shoulder and clinically it does not appear dislocated. Will place pt in splint for scapholunate ligament tear.   12:49 AM: POA (daughter) refused I/O cath and UA as she states pt is DNR and would not get abx even if she had a UTI. I discussed options for disposition of the pt w/ the daughter. I discussed the case w/ Dr. Hurley Cisco (hospitalist) who did not think pt met inpt criteria and would not benefit from obs. I relayed this to the daughter. She is ok w/ taking the pt home and will stay w/ her. I think this is reasonable. The pt is currently w/ hospice and has access to morphine at home for pain control. I have discussed the diagnosis/risks/treatment options with the caregiver and believe the pt to be eligible for discharge home to follow-up with her pcp if she thinks the pt need a higher level of care (currently in assisted living). We also discussed returning to the ED immediately if new or worsening sx occur. We discussed the sx which are most concerning (e.g., worsening pain, AMS, fever) that necessitate immediate return. Medications administered to the patient during their visit and any new prescriptions provided to the patient are listed below.  Medications given during this visit Medications  sodium chloride 0.9 % bolus 500 mL (500 mLs Intravenous New Bag/Given 09/09/13 2150)  fentaNYL (SUBLIMAZE) injection 50 mcg (50 mcg Intravenous Given 09/09/13 2153)  Tdap (BOOSTRIX) injection 0.5 mL (0.5 mLs Intramuscular Given 09/09/13 2151)    New Prescriptions   No medications on file      Pamella Pert, MD 09/10/13 1205

## 2013-09-09 NOTE — ED Notes (Signed)
Patient still off the unit for testing 

## 2013-09-09 NOTE — ED Notes (Signed)
Per [facility], the patient was found in the bathroom post fall, trying to remove brief.  She is from [facility].  Lateral laceration to right side of face controlled bleeding with gauze.  No loss of consciousness.  Degree of blindness.  BP 142/90,  P 88, R 16, O2 sat 94-98% on room air. DNR.

## 2013-09-10 ENCOUNTER — Encounter (HOSPITAL_COMMUNITY): Payer: Self-pay | Admitting: Emergency Medicine

## 2013-09-10 MED ORDER — FENTANYL CITRATE 0.05 MG/ML IJ SOLN
50.0000 ug | Freq: Once | INTRAMUSCULAR | Status: AC
  Administered 2013-09-10: 50 ug via INTRAVENOUS
  Filled 2013-09-10: qty 2

## 2013-09-10 NOTE — ED Notes (Signed)
Sutures present over left eyebrow.

## 2013-09-10 NOTE — ED Notes (Signed)
velcro wrist splint applied to left arm

## 2013-09-10 NOTE — ED Notes (Signed)
Paperwork sent with daughter, copied discharge instructions for [facility]

## 2013-09-10 NOTE — ED Notes (Signed)
Patient's daughter refused need to collect urine sample since patient is dnr and is not receptive to receiving antibiotics. Reported to dr. Romeo AppleHarrison

## 2013-09-10 NOTE — ED Notes (Signed)
Patient awaiting [facility].  

## 2013-09-10 NOTE — ED Notes (Signed)
Discussed with Dr. Romeo AppleHarrison that daughter requests medication for anxiety prior to discharge.

## 2013-11-30 ENCOUNTER — Emergency Department (HOSPITAL_COMMUNITY)

## 2013-11-30 ENCOUNTER — Encounter (HOSPITAL_COMMUNITY): Payer: Self-pay | Admitting: Emergency Medicine

## 2013-11-30 ENCOUNTER — Emergency Department (HOSPITAL_COMMUNITY)
Admission: EM | Admit: 2013-11-30 | Discharge: 2013-12-01 | Disposition: A | Discharge status: Home / Self Care | Attending: Emergency Medicine | Admitting: Emergency Medicine

## 2013-11-30 DIAGNOSIS — H540 Blindness, both eyes: Secondary | ICD-10-CM | POA: Insufficient documentation

## 2013-11-30 DIAGNOSIS — S0990XA Unspecified injury of head, initial encounter: Secondary | ICD-10-CM | POA: Insufficient documentation

## 2013-11-30 DIAGNOSIS — F0390 Unspecified dementia without behavioral disturbance: Secondary | ICD-10-CM | POA: Insufficient documentation

## 2013-11-30 DIAGNOSIS — Z79899 Other long term (current) drug therapy: Secondary | ICD-10-CM | POA: Insufficient documentation

## 2013-11-30 DIAGNOSIS — I10 Essential (primary) hypertension: Secondary | ICD-10-CM | POA: Insufficient documentation

## 2013-11-30 DIAGNOSIS — Z91040 Latex allergy status: Secondary | ICD-10-CM | POA: Diagnosis not present

## 2013-11-30 DIAGNOSIS — G4700 Insomnia, unspecified: Secondary | ICD-10-CM | POA: Insufficient documentation

## 2013-11-30 DIAGNOSIS — F419 Anxiety disorder, unspecified: Secondary | ICD-10-CM | POA: Diagnosis not present

## 2013-11-30 DIAGNOSIS — E079 Disorder of thyroid, unspecified: Secondary | ICD-10-CM | POA: Diagnosis not present

## 2013-11-30 DIAGNOSIS — Y92128 Other place in nursing home as the place of occurrence of the external cause: Secondary | ICD-10-CM | POA: Insufficient documentation

## 2013-11-30 DIAGNOSIS — Z8673 Personal history of transient ischemic attack (TIA), and cerebral infarction without residual deficits: Secondary | ICD-10-CM | POA: Insufficient documentation

## 2013-11-30 DIAGNOSIS — W19XXXA Unspecified fall, initial encounter: Secondary | ICD-10-CM

## 2013-11-30 DIAGNOSIS — Y9389 Activity, other specified: Secondary | ICD-10-CM | POA: Diagnosis not present

## 2013-11-30 DIAGNOSIS — W01198A Fall on same level from slipping, tripping and stumbling with subsequent striking against other object, initial encounter: Secondary | ICD-10-CM | POA: Diagnosis not present

## 2013-11-30 DIAGNOSIS — E039 Hypothyroidism, unspecified: Secondary | ICD-10-CM | POA: Insufficient documentation

## 2013-11-30 NOTE — ED Notes (Signed)
[facility] called for transport.  

## 2013-11-30 NOTE — Discharge Instructions (Signed)
Read the information below.  You may return to the Emergency Department at any time for worsening condition or any new symptoms that concern you.  If possible, please include patient's advanced directiveswhen transporting the patient.   Fall Prevention in Hospitals As a hospital patient, your condition and the treatments you receive can increase your risk for falls. Some additional risk factors for falls in a hospital include:  Being in an unfamiliar environment.  Being on bed rest.  Your surgery.  Taking certain medicines.  Your tubing requirements, such as intravenous (IV) therapy or catheters. It is important that you learn how to decrease fall risks while at the hospital. Below are important tips that can help prevent falls. SAFETY TIPS FOR PREVENTING FALLS Talk about your risk of falling.  Ask your caregiver why you are at risk for falling. Is it your medicine, illness, tubing placement, or something else?  Make a plan with your caregiver to keep you safe from falls.  Ask your caregiver or pharmacist about side effect of your medicines. Some medicines can make you dizzy or affect your coordination. Ask for help.  Ask for help before getting out of bed. You may need to press your call button.  Ask for assistance in getting you safely to the toilet.  Ask for a walker or cane to be put at your bedside. Ask that most of the side rails on your bed be placed up before your caregiver leaves the room.  Ask family or friends to sit with you.  Ask for things that are out of your reach, such as your glasses, hearing aids, telephone, bedside table, or call button. Follow these tips to avoid falling:  Stay lying or seated, rather than standing, while waiting for help.  Wear rubber-soled slippers or shoes whenever you walk in the hospital.  Avoid quick, sudden movements.  Change positions slowly.  Sit on the side of your bed before standing.  Stand up slowly and wait before  you start to walk.  Let your caregiver know if there is a spill on the floor.  Pay careful attention to the medical equipment, electrical cords, and tubes around you.  When you need help, use your call button by your bed or in the bathroom. Wait for one of your caregivers to help you.  If you feel dizzy or unsure of your footing, return to bed and wait for assistance.  Avoid being distracted by the TV, telephone, or another person in your room.  Do not lean or support yourself on rolling objects, such as IV poles or bedside tables. Document Released: 01/17/2000 Document Revised: 01/06/2012 Document Reviewed: 09/27/2011 [facility] Patient Information 2015 [employer] [place] [place]. This information is not intended to replace advice given to you by your health care provider. Make sure you discuss any questions you have with your health care provider.

## 2013-11-30 NOTE — ED Notes (Signed)
[facility] with c/o fall. Per EMS, pt had an unwitnessed fall, however facility said that she hit her head during the unwitnessed fall. Per EMS, pt's only complaint is c-spine pain. Pt has a c-collar in place per EMS. Pt is also legally blind, hx of dementia.

## 2013-11-30 NOTE — ED Provider Notes (Signed)
CSN: [id number]     Arrival date & time 11/30/13  1718 History   First MD Initiated Contact with Patient 11/30/13 1724     Chief Complaint  Patient presents with  . Fall     (Consider location/radiation/quality/duration/timing/severity/associated sxs/prior Treatment) The history is provided by the patient, the EMS personnel, the nursing home and a relative.    Pt with hx dementia brought in by EMS after fall.  Lives at [facility].  Per staff and EMS the fall was unwitnessed and pt also hit her head.  Pt c/o neck pain per EMS.  To me, pt only complains of c-collar bothering her.  Has no other complaints.   Level V caveat for dementia.   Past Medical History  Diagnosis Date  . Thyroid disease   . Hypertension   . Blind   . Anxiety   . Depression   . CVA (cerebral vascular accident)   . Dementia   . Hypothyroid   . A-fib   . Insomnia    Past Surgical History  Procedure Laterality Date  . Appendectomy    . Abdominal hysterectomy    . Eye surgery     No family history on file. History  Substance Use Topics  . Smoking status: Never Smoker   . Smokeless tobacco: Not on file  . Alcohol Use: No   OB History   Grav Para Term Preterm Abortions TAB SAB Ect Mult Living                 Review of Systems  Unable to perform ROS: Dementia      Allergies  Codeine; Latex; Macrolides and ketolides; Nitrofurantoin; Oxycodone; Sulfa antibiotics; and Trimethoprim  Home Medications   Prior to Admission medications   Medication Sig Start Date End Date Taking? Authorizing Provider  acetaminophen (TYLENOL) 500 MG tablet Take 500 mg by mouth every 6 (six) hours as needed for mild pain.     Historical Provider, MD  azelastine (OPTIVAR) 0.05 % ophthalmic solution Place 1 drop into both eyes 2 (two) times daily.    Historical Provider, MD  escitalopram (LEXAPRO) 5 MG tablet Take 5 mg by mouth every morning.    Historical Provider, MD  levothyroxine (SYNTHROID, LEVOTHROID) 100  MCG tablet Take 1 tablet (100 mcg total) by mouth daily before breakfast. 06/14/12   Sorin Luanne Bras, MD  LORazepam (ATIVAN) 0.5 MG tablet Take 0.5 mg by mouth every 8 (eight) hours as needed for anxiety.    Historical Provider, MD  morphine (ROXANOL) 20 MG/ML concentrated solution Take 5 mg by mouth every 4 (four) hours as needed for severe pain.    Historical Provider, MD  pilocarpine (PILOCAR) 1 % ophthalmic solution Place 1 drop into both eyes 4 (four) times daily.    Historical Provider, MD  senna-docusate (SENOKOT-S) 8.6-50 MG per tablet Take 1 tablet by mouth at bedtime. 06/14/12   Sorin Luanne Bras, MD  Silicone-Lanolin [facility] EX) Apply 1 application topically as needed (dry skin).    Historical Provider, MD  Skin Protectants, Misc. (EUCERIN) cream Apply 1 application topically 2 (two) times daily. To lower legs    Historical Provider, MD  timolol (TIMOPTIC) 0.5 % ophthalmic solution Place 1 drop into both eyes 2 (two) times daily.    Historical Provider, MD  traMADol (ULTRAM) 50 MG tablet Take 50 mg by mouth 2 (two) times daily.    Historical Provider, MD  zolpidem (AMBIEN) 5 MG tablet Take 5 mg by mouth at bedtime.  Historical Provider, MD   BP 179/93  Pulse 73  Temp(Src) 98.1 F (36.7 C) (Oral)  Resp 20  SpO2 97% Physical Exam  Nursing note and vitals reviewed. Constitutional: She appears well-developed and well-nourished. No distress.  HENT:  Head: Normocephalic and atraumatic.  Neck: Neck supple.  Cardiovascular: Normal rate and regular rhythm.   Pulmonary/Chest: Effort normal and breath sounds normal. No respiratory distress. She has no wheezes. She has no rales.  Abdominal: Soft. She exhibits no distension. There is no tenderness. There is no rebound and no guarding.  Musculoskeletal: Normal range of motion. She exhibits no tenderness.  Neurological: She is alert.  Oriented to place.  Thinks it's 1973.  Doesn't respond to question of her name.  Doesn't respond to question of  president.    Skin: She is not diaphoretic.    ED Course  Procedures (including critical care time) Labs Review Labs Reviewed - No data to display  Imaging Review Dg Chest 2 View  11/30/2013   CLINICAL DATA:  Patient fell.  Atrial fibrillation hypertension  EXAM: CHEST  2 VIEW  COMPARISON:  September 09, 2013  FINDINGS: There is no edema or consolidation. The heart size and pulmonary vascularity are normal. No adenopathy. Prominence in the right peritracheal region is stable and felt to be due to great vessel prominence in this age group. No pneumothorax. There is stable marked collapse of the T11 vertebral body with kyphosis in this region.  IMPRESSION: Stable marked collapse of the T11 vertebral body with kyphosis in this area. No pneumothorax. Lungs clear.   Electronically Signed   By: Bretta BangWilliam  Woodruff M.D.   On: 11/30/2013 18:02   Dg Pelvis 1-2 Views  11/30/2013   CLINICAL DATA:  Fall at assisted living facility.  Fall today.  EXAM: PELVIS - 1-2 VIEW  COMPARISON:  09/09/2013  FINDINGS: Hips are located. No evidence of acute pelvic fracture or sacral fracture. There is a healed fracture of the left inferior pubic ramus. No evidence of femoral neck fracture on the AP view.  IMPRESSION: No evidence of acute pelvic fracture or hip fracture on frontal view.   Electronically Signed   By: Genevive BiStewart  Edmunds M.D.   On: 11/30/2013 18:05   Ct Head Wo Contrast  11/30/2013   CLINICAL DATA:  Head and neck pain post trauma; fall  EXAM: CT HEAD WITHOUT CONTRAST  CT CERVICAL SPINE WITHOUT CONTRAST  TECHNIQUE: Multidetector CT imaging of the head and cervical spine was performed following the standard protocol without intravenous contrast. Multiplanar CT image reconstructions of the cervical spine were also generated.  COMPARISON:  September 09, 2013 head CT and cervical spine CT ; CT cervical spine November 08, 2010  FINDINGS: CT HEAD FINDINGS  Moderate diffuse atrophy is stable. There is no mass, hemorrhage,  extra-axial fluid collection, or midline shift. Moderate small vessel disease throughout the centra semiovale bilaterally is stable. There is no new gray-white compartment lesion. No acute infarct.  The bony calvarium appears intact. The mastoid air cells are clear. There is diffuse debris in both external auditory canals. There is paranasal sinus disease in both maxillary antra as well as in several ethmoid air cells in the inferior right frontal sinus.  CT CERVICAL SPINE FINDINGS  There is no appreciable fracture or spondylolisthesis. Prevertebral soft tissues and predental space regions are normal. There is moderate disc space narrowing at C5-6. There is milder disc space narrowing at C4-5 and C6-7. There is no disc extrusion or stenosis. There is  process hypertrophy at multiple levels with mild to moderate exit foraminal narrowing at multiple levels bilaterally. There are scattered foci of carotid artery calcification bilaterally. There is a stable nodular lesion in the right apex posteriorly measuring 6 mm. There is prior postoperative change in the upper neck on the right as well as in the right mandible region.  IMPRESSION: Atrophy with small vessel disease in the periventricular regions, stable. No intracranial mass, hemorrhage, or acute appearing infarct. Probable cerumen in each external auditory canal. Multifocal paranasal sinus disease.  CT cervical spine: Osteoarthritic change at multiple levels. No fracture or spondylolisthesis. Stable subcentimeter nodular lesion right apex. Stability of this lesion since 2012 is consistent with benign etiology. There are scattered foci of carotid artery calcification bilaterally.   Electronically Signed   By: Bretta BangWilliam  Woodruff M.D.   On: 11/30/2013 18:16   Ct Cervical Spine Wo Contrast  11/30/2013   CLINICAL DATA:  Head and neck pain post trauma; fall  EXAM: CT HEAD WITHOUT CONTRAST  CT CERVICAL SPINE WITHOUT CONTRAST  TECHNIQUE: Multidetector CT imaging of the  head and cervical spine was performed following the standard protocol without intravenous contrast. Multiplanar CT image reconstructions of the cervical spine were also generated.  COMPARISON:  September 09, 2013 head CT and cervical spine CT ; CT cervical spine November 08, 2010  FINDINGS: CT HEAD FINDINGS  Moderate diffuse atrophy is stable. There is no mass, hemorrhage, extra-axial fluid collection, or midline shift. Moderate small vessel disease throughout the centra semiovale bilaterally is stable. There is no new gray-white compartment lesion. No acute infarct.  The bony calvarium appears intact. The mastoid air cells are clear. There is diffuse debris in both external auditory canals. There is paranasal sinus disease in both maxillary antra as well as in several ethmoid air cells in the inferior right frontal sinus.  CT CERVICAL SPINE FINDINGS  There is no appreciable fracture or spondylolisthesis. Prevertebral soft tissues and predental space regions are normal. There is moderate disc space narrowing at C5-6. There is milder disc space narrowing at C4-5 and C6-7. There is no disc extrusion or stenosis. There is process hypertrophy at multiple levels with mild to moderate exit foraminal narrowing at multiple levels bilaterally. There are scattered foci of carotid artery calcification bilaterally. There is a stable nodular lesion in the right apex posteriorly measuring 6 mm. There is prior postoperative change in the upper neck on the right as well as in the right mandible region.  IMPRESSION: Atrophy with small vessel disease in the periventricular regions, stable. No intracranial mass, hemorrhage, or acute appearing infarct. Probable cerumen in each external auditory canal. Multifocal paranasal sinus disease.  CT cervical spine: Osteoarthritic change at multiple levels. No fracture or spondylolisthesis. Stable subcentimeter nodular lesion right apex. Stability of this lesion since 2012 is consistent with benign  etiology. There are scattered foci of carotid artery calcification bilaterally.   Electronically Signed   By: Bretta BangWilliam  Woodruff M.D.   On: 11/30/2013 18:16     EKG Interpretation None      6:32 PM Son in law now bedside.  He does not want blood or urine taken for testing.  He produced papers stating patient's wishes.  He also wishes in general for no imaging.  States these are the wishes of his wife.    MDM   Final diagnoses:  Fall  Head injury, initial encounter    Pt with hx dementia fell at nursing home, reported to have hit her head. No apparent  injuries noted.  She is at her baseline mentally.  No complaints in ED except for c-collar bothering her.  CT head, c-spine, CXR, pelvis xray obtained prior to son-in-law coming to ED with advanced directives and asked to discontinue orders for blood and urine studies or any further tests.  States he and his wife would choose not to treat anything that was found anyway and he sees it as as waste of resources given the patient's condition.  I have added note in patient's chart to reflect this conversation.  Pt without complaints once c-collar removed. Pt d/c back to facility with no further testing or treatment per family's wishes.     Trixie Dredge, PA-C 12/01/13 [phone]

## 2013-11-30 NOTE — ED Notes (Signed)
Bed: WA22 Expected date:  Expected time:  Means of arrival:  Comments: fall 

## 2013-12-02 NOTE — ED Provider Notes (Signed)
Medical screening examination/treatment/procedure(s) were performed by non-physician practitioner and as supervising physician I was immediately available for consultation/collaboration.   EKG Interpretation   Date/Time:  Thursday November 30 2013 17:25:35 EDT Ventricular Rate:  71 PR Interval:  230 QRS Duration: 105 QT Interval:  409 QTC Calculation: 444 R Axis:   -13 Text Interpretation:  Sinus rhythm Prolonged PR interval Baseline wander  in lead(s) V4 No significant change was found Confirmed by [facility]WOFFORD  MD,  TREY ([id number]) on 11/30/2013 10:55:09 PM        Warnell Foresterrey Reise Gladney, MD 12/02/13 1504

## 2014-02-08 IMAGING — CR DG ABDOMEN 1V
1 series · 1 of 1 positions shown · non-contrast
Comparison: CT 06/08/2012.

CLINICAL DATA: Constipation.  Urinary retention.

ABDOMEN - 1 VIEW

[x abdomen supine]
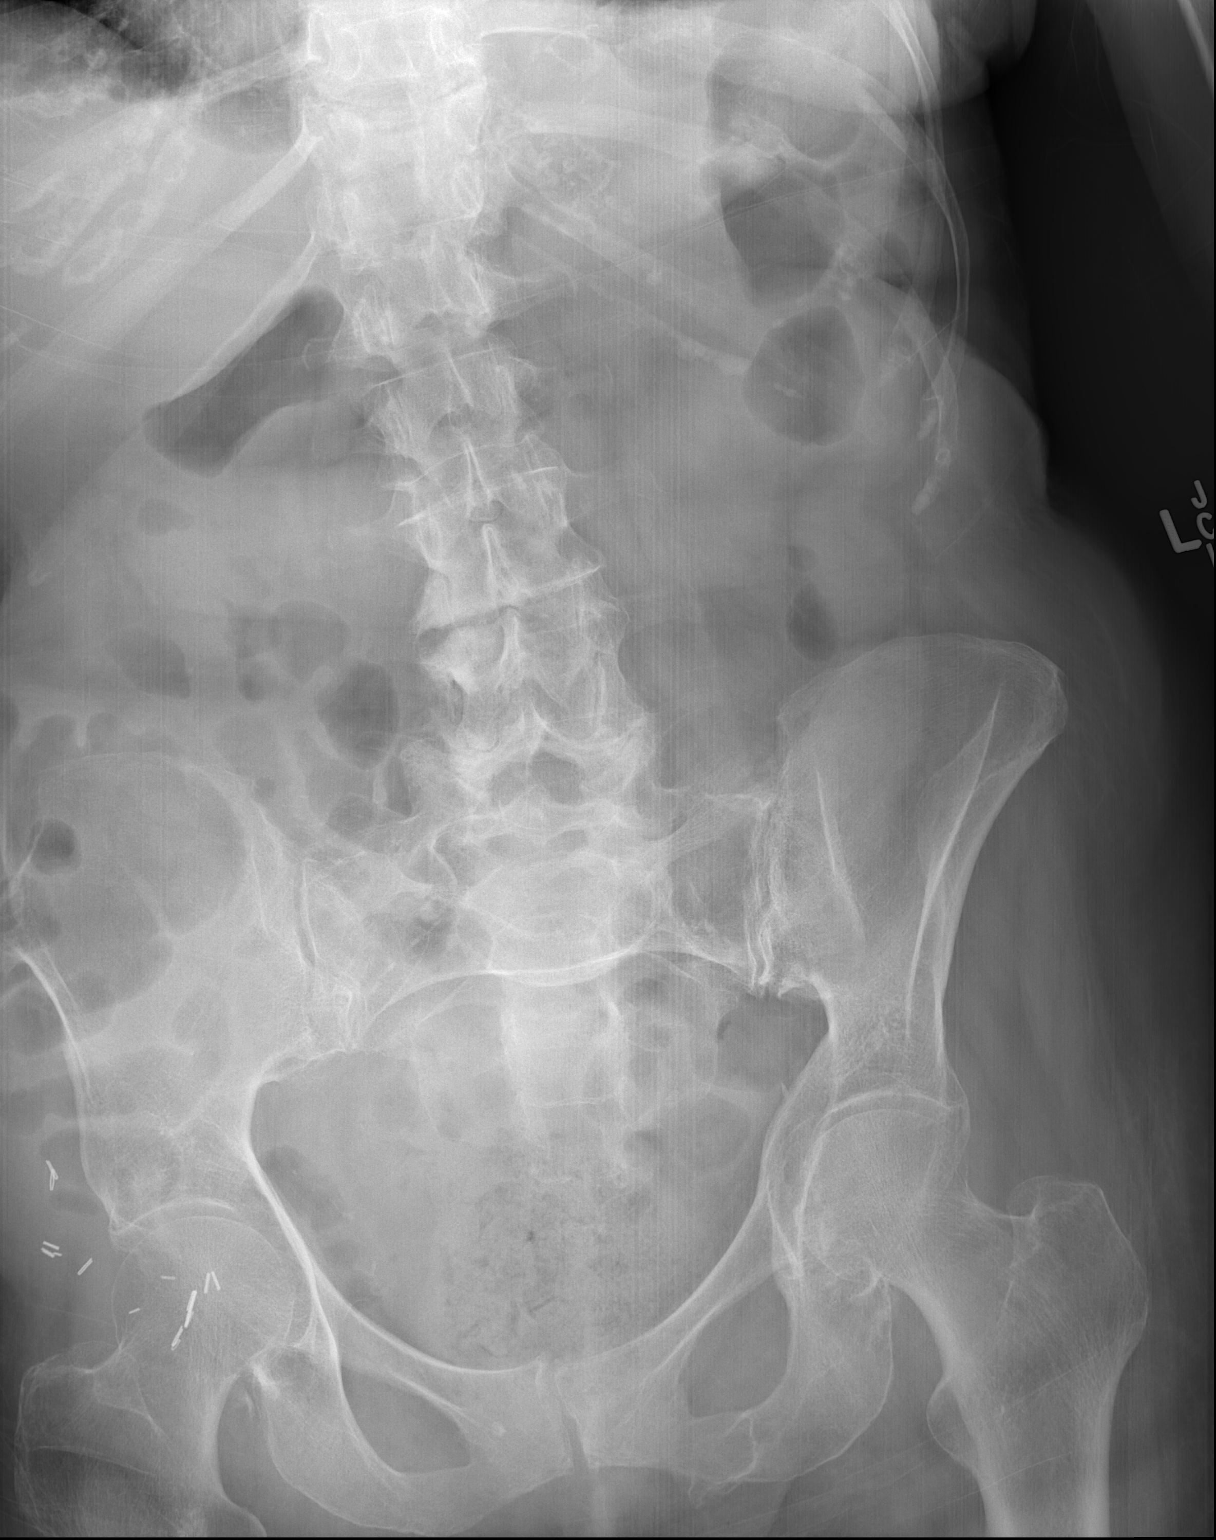

[1 of 1 positions shown; findings below may reference images not displayed]

FINDINGS: The right lateral abdomen is excluded from view on this
single frontal radiograph.  There is no gross plain film evidence
of free air.  Surgical clips are present over the right hip,
possibly related to vascular surgery or herniorrhaphy.
Atherosclerosis.  Lumbar spondylosis.

The bowel gas pattern is nonobstructive.  There are no dilated
loops of large or small bowel.  Stool burden appears within normal
limits.  Stool and bowel gas are present within the rectosigmoid.
IMPRESSION: Visualized bowel gas pattern within normal limits.

## 2014-05-04 DEATH — deceased

## 2015-05-08 IMAGING — CR DG SHOULDER 2+V*L*
4 series · 4 of 4 positions shown · non-contrast
Comparison: None.

CLINICAL DATA: Patient is disoriented, fell, shoulder pain

EXAM:
LEFT SHOULDER - 2+ VIEW

[x shoulder ap left (1 of 4)]
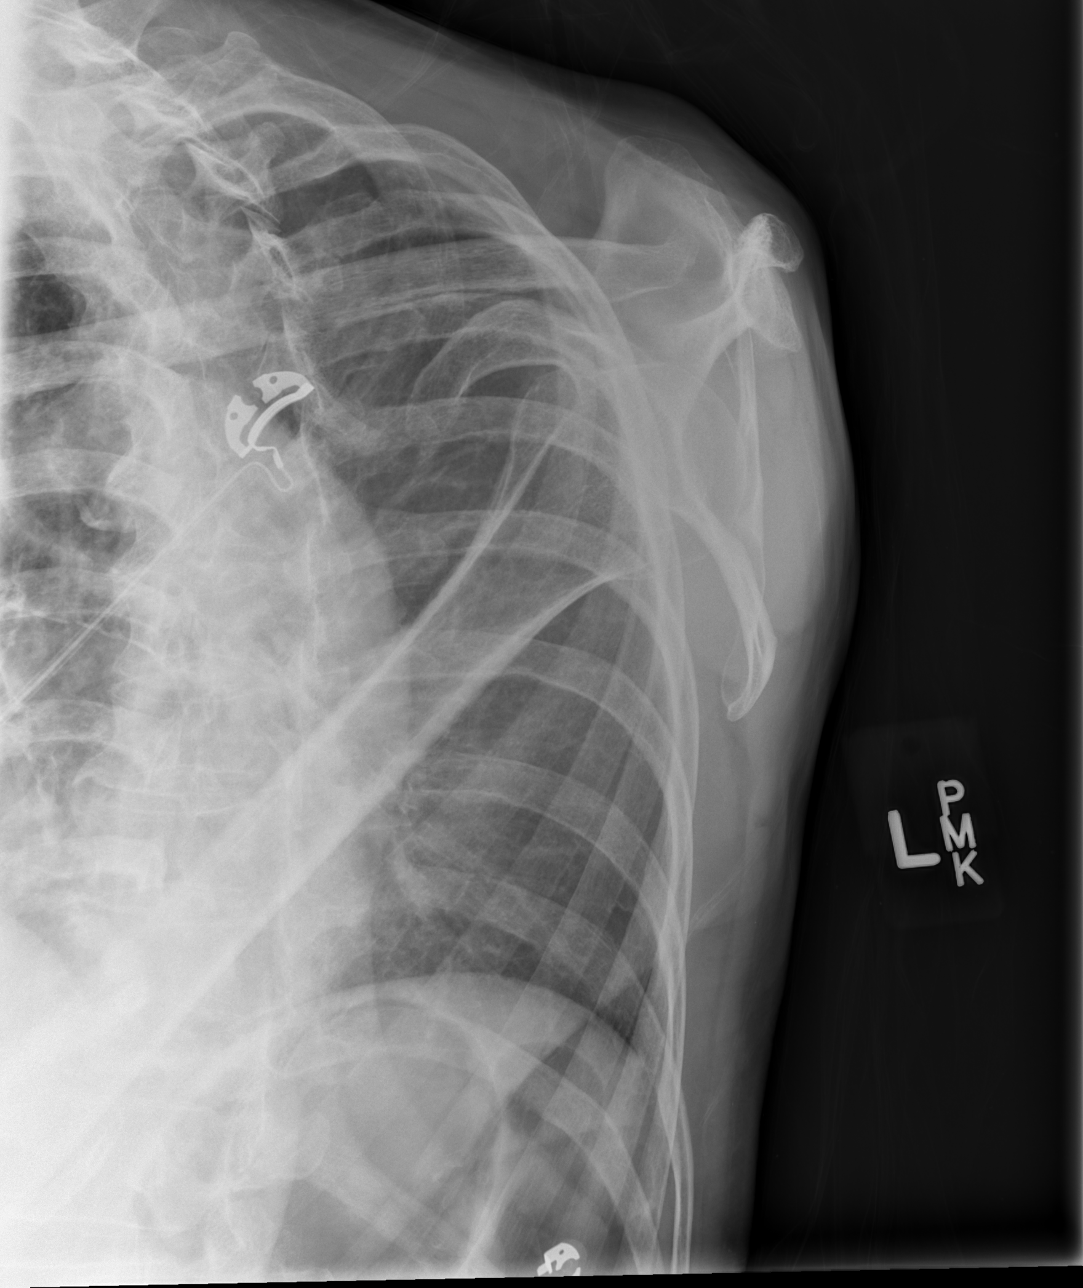

[x shoulder ap left (2 of 4)]
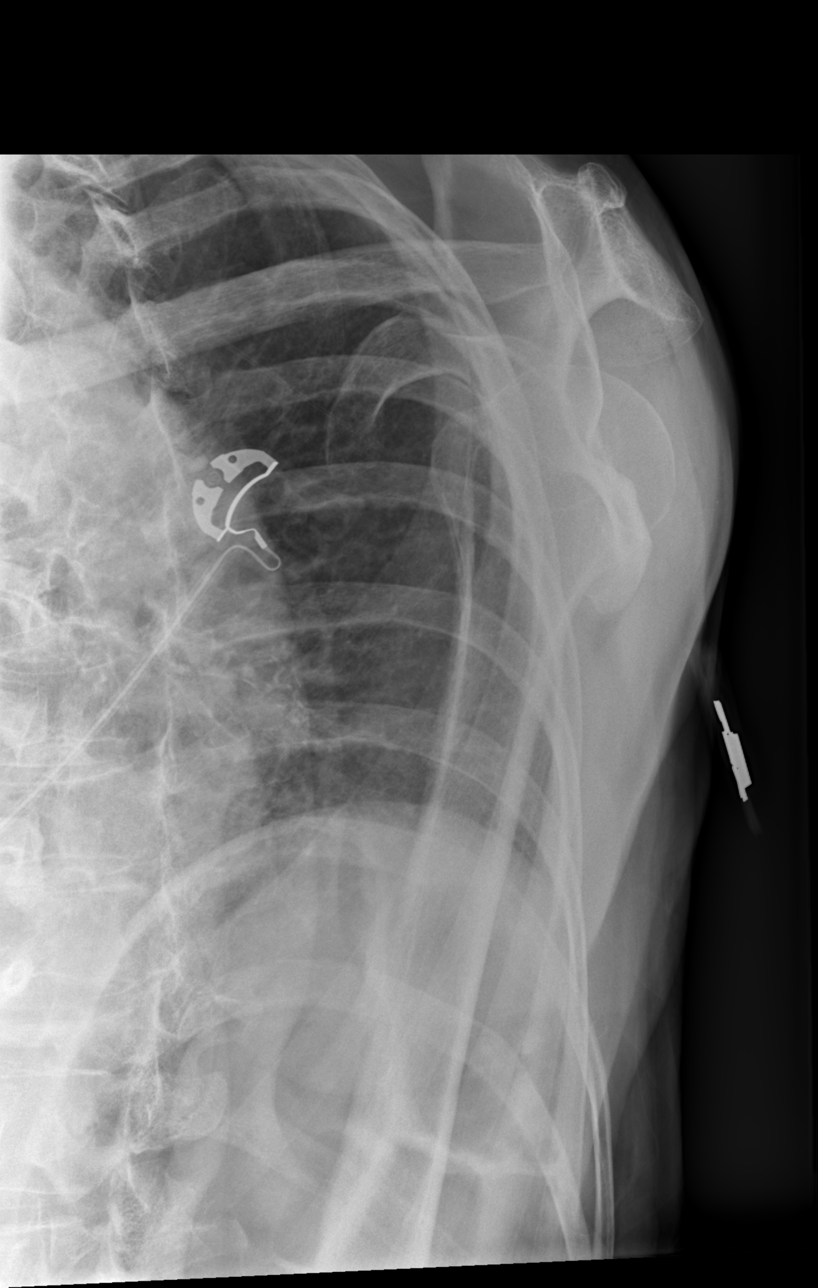

[x shoulder ap left (3 of 4)]
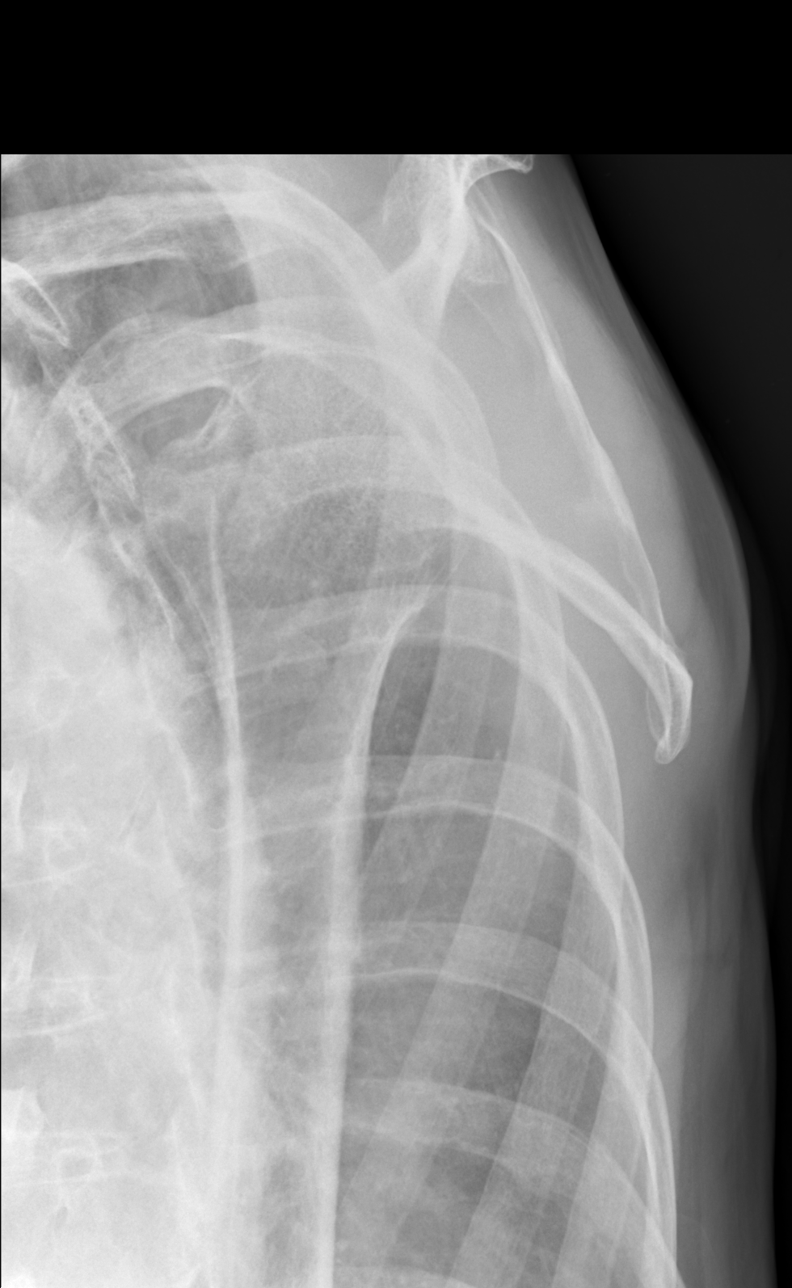

[x shoulder ap left (4 of 4)]
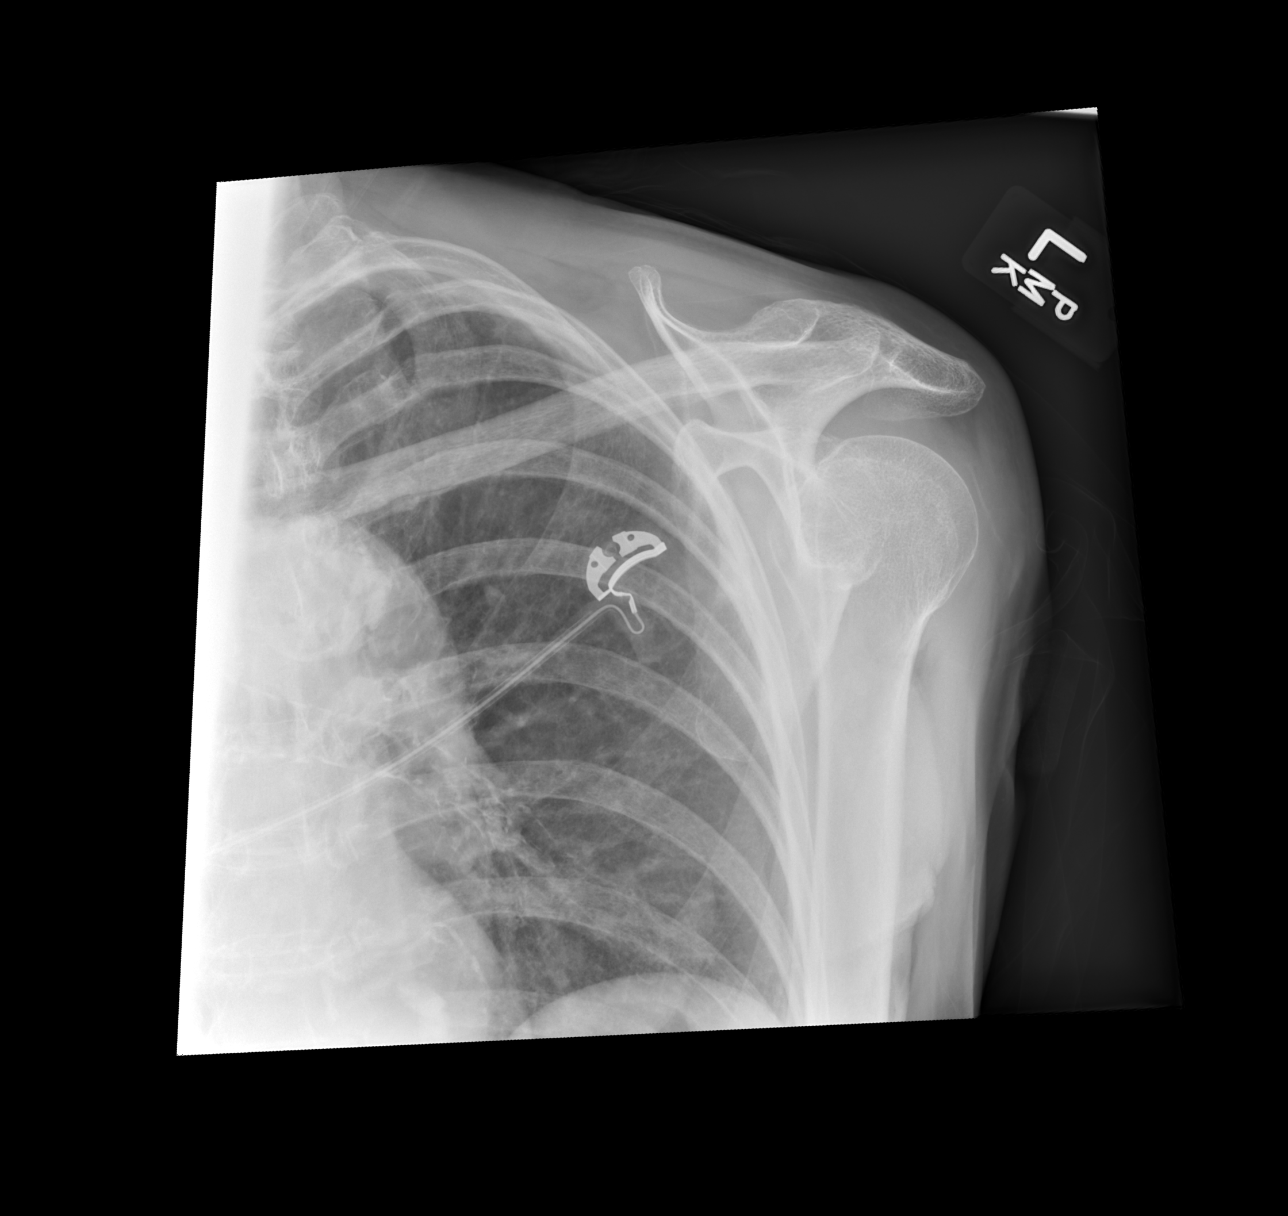

[4 of 4 positions shown; findings below may reference images not displayed]

FINDINGS: Suboptimal images due to limited patient ability to cooperate. No
fracture involving the humerus is identified. The humeral head does
appear to be in MI of the anterior and inferior position. This study
is performed with the scapula in abnormal position.
IMPRESSION: Limited study as described above. Possible anterior dislocation. An
axillary view would be helpful. Cannot exclude winging of the
scapula.

## 2015-05-08 IMAGING — CR DG PELVIS 1-2V
1 series · 1 of 1 positions shown · non-contrast
Comparison: 11/08/2010.

CLINICAL DATA: Fall disoriented uncooperative

EXAM:
PELVIS - 1-2 VIEW

[x pelvis]
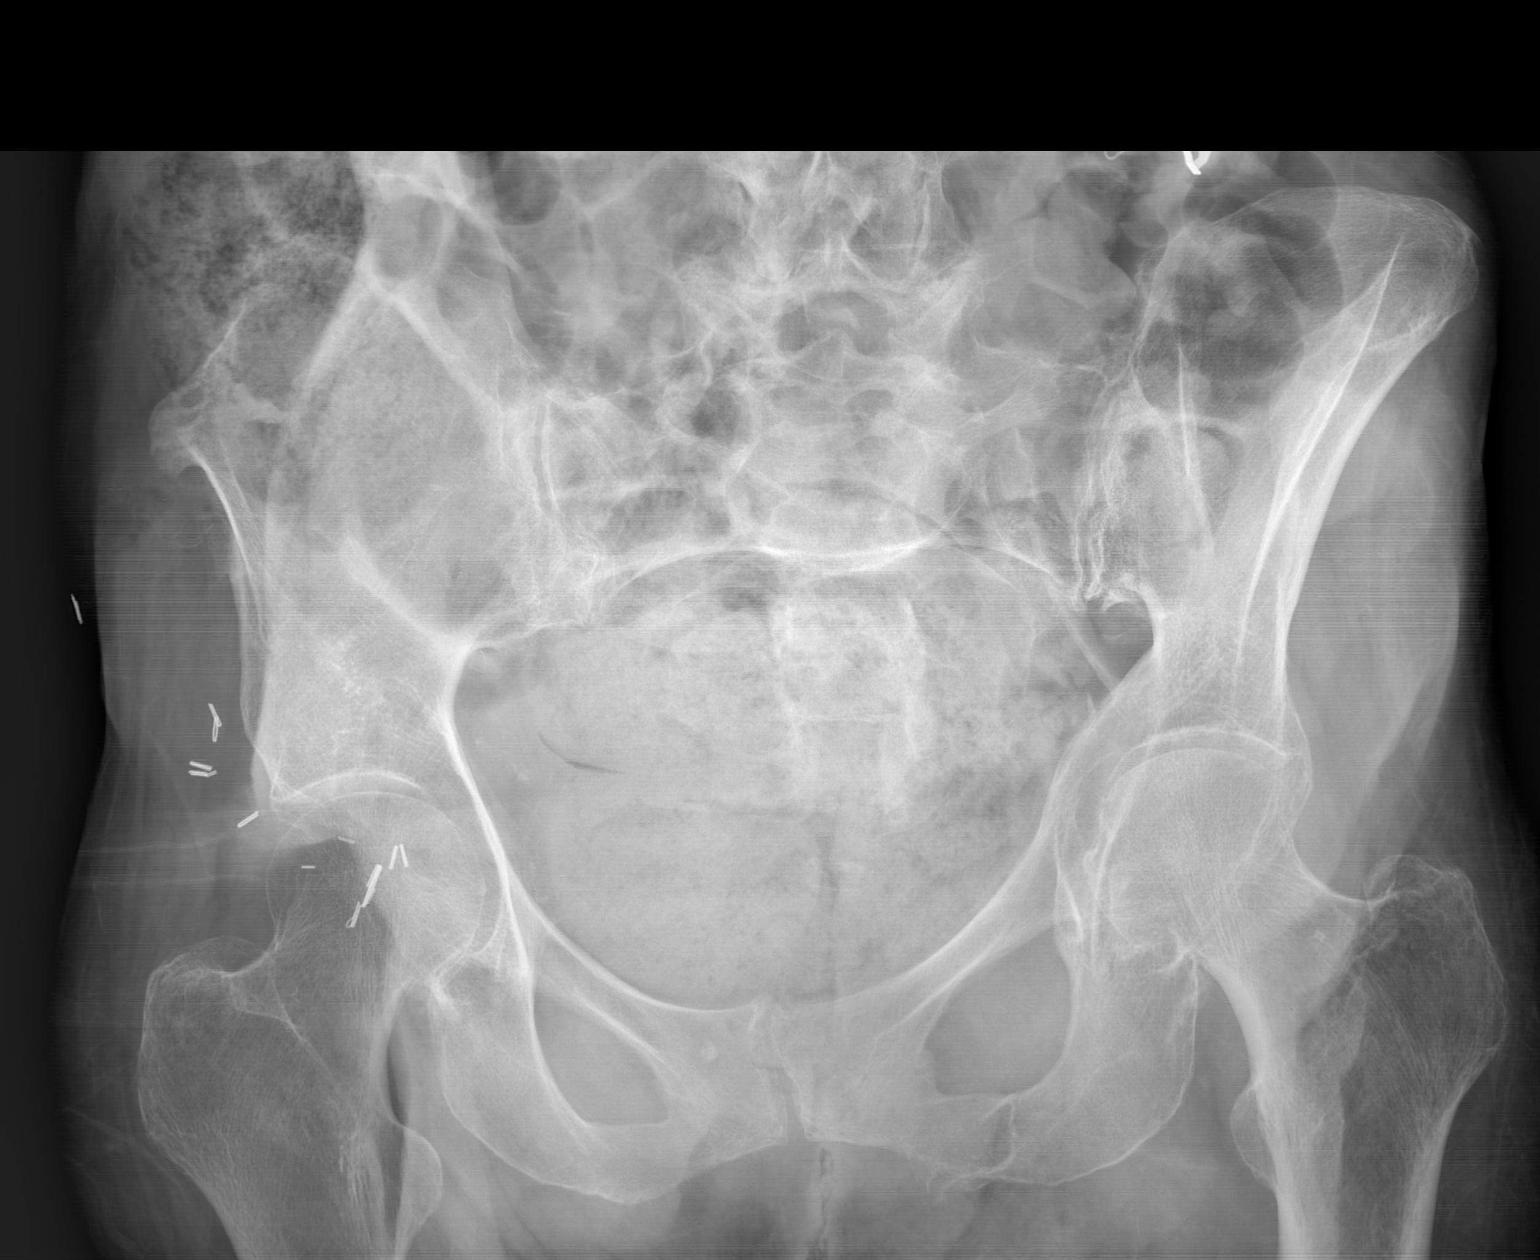

[1 of 1 positions shown; findings below may reference images not displayed]

FINDINGS: Significant fecal retention particularly in the rectum. Bowel
contents obscure the sacrum and the iliac bones. Cortical
irregularity and lucency of the inferior pubic ramus on the left.
This appearance is stable from 11/08/2010.
IMPRESSION: No acute findings
